# Patient Record
Sex: Male | Born: 1980
Health system: Southern US, Community
[De-identification: ages and names within clinical notes are randomized; demographics above are authoritative.]

## PROBLEM LIST (undated history)

## (undated) DIAGNOSIS — F909 Attention-deficit hyperactivity disorder, unspecified type: Secondary | ICD-10-CM

## (undated) DIAGNOSIS — K219 Gastro-esophageal reflux disease without esophagitis: Secondary | ICD-10-CM

## (undated) HISTORY — PX: APPENDECTOMY: SHX54

## (undated) HISTORY — DX: Gastro-esophageal reflux disease without esophagitis: K21.9

## (undated) HISTORY — DX: Attention-deficit hyperactivity disorder, unspecified type: F90.9

---

## 2009-03-05 ENCOUNTER — Ambulatory Visit: Payer: Self-pay | Admitting: Internal Medicine

## 2009-03-05 DIAGNOSIS — A630 Anogenital (venereal) warts: Secondary | ICD-10-CM | POA: Insufficient documentation

## 2009-03-05 DIAGNOSIS — F988 Other specified behavioral and emotional disorders with onset usually occurring in childhood and adolescence: Secondary | ICD-10-CM | POA: Insufficient documentation

## 2009-04-20 ENCOUNTER — Telehealth: Payer: Self-pay | Admitting: Internal Medicine

## 2009-05-31 ENCOUNTER — Telehealth: Payer: Self-pay | Admitting: Internal Medicine

## 2009-07-11 ENCOUNTER — Telehealth: Payer: Self-pay | Admitting: Internal Medicine

## 2009-08-20 ENCOUNTER — Ambulatory Visit: Payer: Self-pay | Admitting: Endocrinology

## 2009-08-20 DIAGNOSIS — M25519 Pain in unspecified shoulder: Secondary | ICD-10-CM | POA: Insufficient documentation

## 2009-08-29 ENCOUNTER — Telehealth (INDEPENDENT_AMBULATORY_CARE_PROVIDER_SITE_OTHER): Payer: Self-pay | Admitting: *Deleted

## 2009-09-17 ENCOUNTER — Encounter: Admission: RE | Admit: 2009-09-17 | Discharge: 2009-09-17 | Payer: Self-pay | Admitting: Specialist

## 2009-09-26 ENCOUNTER — Telehealth: Payer: Self-pay | Admitting: Internal Medicine

## 2009-10-29 ENCOUNTER — Telehealth: Payer: Self-pay | Admitting: Internal Medicine

## 2009-12-14 ENCOUNTER — Telehealth: Payer: Self-pay | Admitting: Internal Medicine

## 2010-02-14 ENCOUNTER — Telehealth: Payer: Self-pay | Admitting: Internal Medicine

## 2010-03-05 NOTE — Progress Notes (Signed)
Summary: REFILL  Phone Note Refill Request Call back at Home Phone [phone]   Refills Requested: Medication #1:  ADDERALL 10 MG TABS Take 1 tablet by mouth two times a day Initial call taken by: Lamar Sprinkles, CMA,  July 11, 2009 1:57 PM  Follow-up for Phone Call        pt informed via VM that requested Rx is available for pick up. Rx in cabinet up front Follow-up by: Margaret Pyle, CMA,  July 12, 2009 3:52 PM    Prescriptions: ADDERALL 10 MG TABS (AMPHETAMINE-DEXTROAMPHETAMINE) Take 1 tablet by mouth two times a day  #60 x 0   Entered and Authorized by:   Etta Grandchild MD   Signed by:   Etta Grandchild MD on 07/11/2009   Method used:   Print then Give to Patient   RxID:   [id number]

## 2010-03-05 NOTE — Assessment & Plan Note (Signed)
Summary: NEW/ [employer]/ [employer]  #   Vital Signs:  Patient profile:   30 year old male Height:      69 inches Weight:      170 pounds BMI:     25.20 O2 Sat:      98 % on Room air Temp:     98.6 degrees F oral Pulse rate:   68 / minute Pulse rhythm:   regular Resp:     16 per minute BP sitting:   126 / 80  (left arm) Cuff size:   large  Vitals Entered By: Rock Nephew CMA (March 05, 2009 4:11 PM)  Nutrition Counseling: Patient's BMI is greater than 25 and therefore counseled on weight management options.  O2 Flow:  Room air  Primary Care Provider:  Etta Grandchild MD   History of Present Illness: New to me ne needs a PCP but has concerns 1. warts at the base of his penis for several years 2. heart has an intermittently feeling that it is hollow since he was 38 yoa and smoked THC 3. wants a complete physical 4. needs a refill on Adderall, he's doing well on it  Preventive Screening-Counseling & Management  Alcohol-Tobacco     Alcohol drinks/day: <1     Alcohol type: spirits     >5/day in last 3 mos: no     Alcohol Counseling: not indicated; use of alcohol is not excessive or problematic     Feels need to cut down: no     Feels annoyed by complaints: no     Feels guilty re: drinking: no     Needs 'eye opener' in am: no     Smoking Status: never  Caffeine-Diet-Exercise     Does Patient Exercise: yes  Hep-HIV-STD-Contraception     Hepatitis Risk: no risk noted     HIV Risk: no risk noted     STD Risk: risk noted     STD Risk Counseling: to avoid increased STD risk     TSE monthly: yes     Testicular SE Education/Counseling to perform regular STE      Sexual History:  currently monogamous.        Drug Use:  former and no.        Blood Transfusions:  no.    Clinical Review Panels:  Immunizations   Last Tetanus Booster:  Tdap (02/03/2005)   Medications Prior to Update: 1)  None  Current Medications (verified): 1)  Adderall 10 Mg Tabs  (Amphetamine-Dextroamphetamine) .... Take 1 Tablet By Mouth Two Times A Day  Allergies (verified): No Known Drug Allergies  Past History:  Past Medical History: ADHD  Past Surgical History: Appendectomy  Family History: Family History High cholesterol Family History Hypertension  Social History: Occupation: Pensions consultant Single Never Smoked Alcohol use-yes Drug use-no Regular exercise-yes Smoking Status:  never Drug Use:  former, no Does Patient Exercise:  yes Hepatitis Risk:  no risk noted HIV Risk:  no risk noted STD Risk:  risk noted Sexual History:  currently monogamous Blood Transfusions:  no  Review of Systems       The patient complains of weight gain.  The patient denies anorexia, fever, weight loss, syncope, dyspnea on exertion, abdominal pain, enlarged lymph nodes, and testicular masses.   CV:  Denies chest pain or discomfort, difficulty breathing at night, fainting, fatigue, leg cramps with exertion, lightheadness, near fainting, palpitations, shortness of breath with exertion, swelling of feet, swelling of hands, and weight gain. GU:  Complains of genital sores; denies discharge, dysuria, hematuria, incontinence, nocturia, urinary frequency, and urinary hesitancy. Psych:  Denies anxiety, depression, easily angered, easily tearful, irritability, panic attacks, sense of great danger, suicidal thoughts/plans, and thoughts of violence.  Physical Exam  General:  alert, well-developed, well-nourished, well-hydrated, appropriate dress, normal appearance, healthy-appearing, cooperative to examination, and good hygiene.   Head:  normocephalic, atraumatic, no abnormalities observed, and no abnormalities palpated.   Eyes:  vision grossly intact, pupils equal, pupils round, and pupils reactive to light.   Ears:  R ear normal and L ear normal.   Mouth:  Oral mucosa and oropharynx without lesions or exudates.  Teeth in good repair. Neck:  No deformities, masses, or tenderness  noted. Chest Wall:  No deformities, masses, tenderness or gynecomastia noted. Lungs:  Normal respiratory effort, chest expands symmetrically. Lungs are clear to auscultation, no crackles or wheezes. Heart:  Normal rate and regular rhythm. S1 and S2 normal without gallop, murmur, click, rub or other extra sounds. Abdomen:  soft, non-tender, normal bowel sounds, no distention, no masses, no guarding, no rigidity, no rebound tenderness, no abdominal hernia, no inguinal hernia, no hepatomegaly, no splenomegaly, and abdominal scar(s).   Genitalia:  circumcised, no hydrocele, no varicocele, no scrotal masses, no testicular masses or atrophy, no urethral discharge, and condylomata at the left base of his penis.   Additional Exam:  EKG has early repol. but no q waves and no acute ST/T wave changes.   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  Discussed exercise and checking cholesterol.  Discussed gun safety and safe sex.  Complete Medication List: 1)  Adderall 10 Mg Tabs (Amphetamine-dextroamphetamine) .... Take 1 tablet by mouth two times a day 2)  Aldara 5 % Crea (Imiquimod) .... Apply to warts w monday/wednesday/friday for 4 weeks  Patient Instructions: 1)  Please schedule a follow-up appointment in 1 month. 2)  If you could be exposed to sexually transmitted diseases, you should use a  condom. Prescriptions: ADDERALL 10 MG TABS (AMPHETAMINE-DEXTROAMPHETAMINE) Take 1 tablet by mouth two times a day  #60 x 0   Entered and Authorized by:   Etta Grandchild MD   Signed by:   Etta Grandchild MD on 03/05/2009   Method used:   Print then Give to Patient   RxID:   [id number] ALDARA 5 % CREA (IMIQUIMOD) apply to warts w Monday/Wednesday/Friday for 4 weeks  #8 packs x 1   Entered and Authorized by:   Etta Grandchild MD   Signed by:   Etta Grandchild MD on 03/05/2009   Method used:   Print then Give to Patient   RxID:   [id number]   Preventive Care Screening  Last Tetanus Booster:    Date:  02/03/2005    Results:  Tdap

## 2010-03-05 NOTE — Progress Notes (Signed)
Summary: REFILL  Phone Note Refill Request   Refills Requested: Medication #1:  ADDERALL 10 MG TABS Take 1 tablet by mouth two times a day Initial call taken by: Lamar Sprinkles, CMA,  April 20, 2009 12:15 PM  Follow-up for Phone Call        Called and left pt a message to call the office back Follow-up by: Rock Nephew CMA,  April 23, 2009 8:40 AM/ Sydell Axon, SMA  Additional Follow-up for Phone Call Additional follow up Details #1::        Patient notified rx ready for pick up and placed up front. Additional Follow-up by: Rock Nephew CMA,  April 23, 2009 11:24 AM    Prescriptions: ADDERALL 10 MG TABS (AMPHETAMINE-DEXTROAMPHETAMINE) Take 1 tablet by mouth two times a day  #60 x 0   Entered and Authorized by:   Etta Grandchild MD   Signed by:   Etta Grandchild MD on 04/20/2009   Method used:   Print then Give to Patient   RxID:   [id number]

## 2010-03-05 NOTE — Progress Notes (Signed)
Summary: REFILL  Phone Note Refill Request Call back at Home Phone [phone]   Refills Requested: Medication #1:  ADDERALL 10 MG TABS Take 1 tablet by mouth two times a day Initial call taken by: Lamar Sprinkles, CMA,  October 29, 2009 10:02 AM  Follow-up for Phone Call        [facility] rx ready and place dup front.Marland KitchenMarland KitchenAlvy Beal Archie CMA  October 29, 2009 11:55 AM     Prescriptions: ADDERALL 10 MG TABS (AMPHETAMINE-DEXTROAMPHETAMINE) Take 1 tablet by mouth two times a day  #60 x 0   Entered and Authorized by:   Etta Grandchild MD   Signed by:   Etta Grandchild MD on 10/29/2009   Method used:   Print then Give to Patient   RxID:   [id number]

## 2010-03-05 NOTE — Progress Notes (Signed)
Summary: Adderall refill  Phone Note Refill Request Message from:  Patient on May 31, 2009 10:57 AM  Refills Requested: Medication #1:  ADDERALL 10 MG TABS Take 1 tablet by mouth two times a day Initial call taken by: Lucious Groves,  May 31, 2009 10:57 AM  Follow-up for Phone Call        left message for patient to call back Follow-up by: Brenton Grills,  May 31, 2009 1:21 PM    Prescriptions: ADDERALL 10 MG TABS (AMPHETAMINE-DEXTROAMPHETAMINE) Take 1 tablet by mouth two times a day  #60 x 0   Entered and Authorized by:   Etta Grandchild MD   Signed by:   Etta Grandchild MD on 05/31/2009   Method used:   Print then Give to Patient   RxID:   [id number]

## 2010-03-05 NOTE — Progress Notes (Signed)
  Phone Note Other Incoming   Request: Send information Summary of Call: Request for records received from [facility]. Request forwarded to [employer].

## 2010-03-05 NOTE — Progress Notes (Signed)
  Phone Note Call from Patient Call back at [facility] Phone [phone]   Caller: Patient Call For: Dr Yetta Barre Summary of Call: Pt requests refill of Adderall.Please advise. Initial call taken by: Verdell Face,  September 26, 2009 11:36 AM  Follow-up for Phone Call        Patient notified.Marland KitchenMarland KitchenAlvy Beal Archie CMA  September 26, 2009 1:51 PM   rx placed upfront    Prescriptions: ADDERALL 10 MG TABS (AMPHETAMINE-DEXTROAMPHETAMINE) Take 1 tablet by mouth two times a day  #60 x 0   Entered and Authorized by:   Etta Grandchild MD   Signed by:   Etta Grandchild MD on 09/26/2009   Method used:   Print then Give to Patient   RxID:   [id number]

## 2010-03-05 NOTE — Assessment & Plan Note (Signed)
Summary: DISLOCATED SHOULDER AGAIN/ DIDN'T GO TO ER/[facility] PT / NWS   Vital Signs:  Patient profile:   30 year old male Height:      69 inches (175.26 cm) Weight:      183.25 pounds (83.30 kg) BMI:     27.16 O2 Sat:      97 % on Room air Temp:     98.6 degrees F (37.00 degrees C) oral Pulse rate:   77 / minute BP sitting:   122 / 82  (left arm) Cuff size:   large  Vitals Entered By: Brenton Grills MA (August 20, 2009 3:16 PM)  O2 Flow:  Room air CC: dislocated right shoulder yesterday/took Aleve for pain/[facility] pt/aj Is Patient Diabetic? No   Primary Provider:  Etta Grandchild MD  CC:  dislocated right shoulder yesterday/took Aleve for pain/[facility] pt/aj.  History of Present Illness: [place], [place] and [place].  he broke his fall with his right arm, and has moderate pain there x 1 day, since the fall.  there is associated numbness at the shoulder.  he has dislocated his right shoulder in the past, 1-2x/year.  however, this episode was worse than the others.  since the fall, he thought it has relocated.  he was told in the past that he possibly had a torn labrum, but has not seen orthopedic dr since he moved to [place] 9 mos ago.    Current Medications (verified): 1)  Adderall 10 Mg Tabs (Amphetamine-Dextroamphetamine) .... Take 1 Tablet By Mouth Two Times A Day 2)  Aldara 5 % Crea (Imiquimod) .... Apply To Warts W Monday/wednesday/friday For 4 Weeks  Allergies (verified): No Known Drug Allergies  Review of Systems  The patient denies syncope.         denies pain elsewhere in the body  Physical Exam  Msk:  right shoulder: cannot abduct at all, due to pain. there is diffuse tenderness. Pulses:  right radial is intact Neurologic:  sensation is intact to touch on the right hand Additional Exam:  DG SHOULDER *R* - [id number] No fracture or dislocation.   Impression & Recommendations:  Problem # 1:  SHOULDER PAIN, RIGHT (ICD-719.41) Assessment  New uncertain etiology  Medications Added to Medication List This Visit: 1)  Hydrocodone-acetaminophen 10-325 Mg Tabs (Hydrocodone-acetaminophen) .Marland Kitchen.. 1 every 4 hrs as needed for pain  Other Orders: Orthopedic Surgeon Referral (Ortho Surgeon) T-Shoulder Right (73030TC) Est. Patient Level IV (84696)  Patient Instructions: 1)  x ray 2)  vicodin 1 every 4 hrn as needed for pain. 3)  refer orthopedics.   4)  here is a refill of the adderall.  please see dr Yetta Barre within 30 days, which would be necessary for any future refills.   5)  (update: i left message on phone-tree:  rx as we discussed) Prescriptions: ADDERALL 10 MG TABS (AMPHETAMINE-DEXTROAMPHETAMINE) Take 1 tablet by mouth two times a day  #60 x 0   Entered and Authorized by:   Minus Breeding MD   Signed by:   Minus Breeding MD on 08/20/2009   Method used:   Print then Give to Patient   RxID:   [id number] HYDROCODONE-ACETAMINOPHEN 10-325 MG TABS (HYDROCODONE-ACETAMINOPHEN) 1 every 4 hrs as needed for pain  #50 x 0   Entered and Authorized by:   Minus Breeding MD   Signed by:   Minus Breeding MD on 08/20/2009   Method used:   Print then Give to Patient  RxID:   [id number]

## 2010-03-05 NOTE — Progress Notes (Signed)
  Phone Note Refill Request Call back at Home Phone [phone]   Refills Requested: Medication #1:  ADDERALL 10 MG TABS Take 1 tablet by mouth two times a day Initial call taken by: Lamar Sprinkles, CMA,  December 14, 2009 10:06 AM  Follow-up for Phone Call        [facility] for pt to pick up/la Follow-up by: Rock Nephew CMA,  December 14, 2009 10:49 AM    Prescriptions: ADDERALL 10 MG TABS (AMPHETAMINE-DEXTROAMPHETAMINE) Take 1 tablet by mouth two times a day  #120 x 0   Entered and Authorized by:   Etta Grandchild MD   Signed by:   Etta Grandchild MD on 12/14/2009   Method used:   Print then Give to Patient   RxID:   [id number]

## 2010-03-07 NOTE — Progress Notes (Signed)
Summary: REFILL ADDERALL   Phone Note Refill Request   Refills Requested: Medication #1:  ADDERALL 10 MG TABS Take 1 tablet by mouth two times a day Initial call taken by: Lamar Sprinkles, CMA,  February 14, 2010 1:59 PM  Follow-up for Phone Call        left mess to call office back, rx ready Follow-up by: Lamar Sprinkles, CMA,  February 14, 2010 4:44 PM  Additional Follow-up for Phone Call Additional follow up Details #1::        Pt advised via VM, rx in cabinet fot pt pick up Additional Follow-up by: Margaret Pyle, CMA,  February 15, 2010 9:44 AM    Prescriptions: ADDERALL 10 MG TABS (AMPHETAMINE-DEXTROAMPHETAMINE) Take 1 tablet by mouth two times a day  #120 x 0   Entered and Authorized by:   Etta Grandchild MD   Signed by:   Etta Grandchild MD on 02/14/2010   Method used:   Print then Give to Patient   RxID:   [id number]

## 2010-05-06 ENCOUNTER — Telehealth: Payer: Self-pay | Admitting: Internal Medicine

## 2010-05-06 DIAGNOSIS — F909 Attention-deficit hyperactivity disorder, unspecified type: Secondary | ICD-10-CM

## 2010-05-06 MED ORDER — AMPHETAMINE-DEXTROAMPHETAMINE 10 MG PO TABS: 10.0000 mg | ORAL_TABLET | Freq: Two times a day (BID) | ORAL | Status: DC

## 2010-05-06 NOTE — Telephone Encounter (Signed)
Done, please have him f/up with me soon

## 2010-05-06 NOTE — Telephone Encounter (Signed)
Pt requesting refill for Adderall. Please inform when ready for p/u.

## 2010-05-08 ENCOUNTER — Telehealth: Payer: Self-pay

## 2010-05-08 NOTE — Telephone Encounter (Signed)
Patient notified to pick up signed rx. rx placed upfront

## 2010-05-08 NOTE — Telephone Encounter (Signed)
LMOM that along w/Kisha's msge from earlier today that his Rx script is available for p/u at office that he also needs to contact our office to schedule a f/u OV w/ Dr Yetta Barre.

## 2010-06-25 ENCOUNTER — Telehealth: Payer: Self-pay | Admitting: *Deleted

## 2010-06-25 DIAGNOSIS — F909 Attention-deficit hyperactivity disorder, unspecified type: Secondary | ICD-10-CM

## 2010-06-25 MED ORDER — AMPHETAMINE-DEXTROAMPHETAMINE 10 MG PO TABS: 10.0000 mg | ORAL_TABLET | Freq: Two times a day (BID) | ORAL | Status: DC

## 2010-06-25 NOTE — Telephone Encounter (Signed)
Patient requesting RF of Adderall. He had OV scheduled for this week but was r/s by MD. Rip Harbour for RF?

## 2010-06-25 NOTE — Telephone Encounter (Signed)
Ok for standard refills - 3 Rx's

## 2010-06-25 NOTE — Telephone Encounter (Signed)
Pending signature

## 2010-06-26 NOTE — Telephone Encounter (Signed)
Left vm for pt that RX was ready for pick up. Only printed one RX as this is what PCP usually gives pt.

## 2010-06-28 ENCOUNTER — Ambulatory Visit: Payer: Self-pay | Admitting: Internal Medicine

## 2010-08-05 ENCOUNTER — Ambulatory Visit (INDEPENDENT_AMBULATORY_CARE_PROVIDER_SITE_OTHER): Payer: BC Managed Care – PPO | Admitting: Internal Medicine

## 2010-08-05 ENCOUNTER — Encounter: Payer: Self-pay | Admitting: Internal Medicine

## 2010-08-05 VITALS — BP 118/78 | HR 56 | Temp 98.6°F | Resp 16 | Wt 177.8 lb

## 2010-08-05 DIAGNOSIS — F988 Other specified behavioral and emotional disorders with onset usually occurring in childhood and adolescence: Secondary | ICD-10-CM

## 2010-08-05 DIAGNOSIS — F909 Attention-deficit hyperactivity disorder, unspecified type: Secondary | ICD-10-CM

## 2010-08-05 MED ORDER — AMPHETAMINE-DEXTROAMPHETAMINE 10 MG PO TABS: 10.0000 mg | ORAL_TABLET | Freq: Two times a day (BID) | ORAL | Status: DC

## 2010-08-05 NOTE — Progress Notes (Signed)
  Subjective:    Patient ID: Kenneth Haney, male    DOB: 15-Apr-1980, 30 y.o.   MRN: [id number]  HPI He returns for f/up and he tells me that he is doing well on Adderall with no side effects. 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   Review of Systems  Constitutional: Negative for fever, chills, diaphoresis, activity change, appetite change, fatigue and unexpected weight change.  HENT: Negative for facial swelling, neck pain and neck stiffness.   Eyes: Negative.   Respiratory: Negative.   Cardiovascular: Negative.   Gastrointestinal: Negative.   Genitourinary: Negative.   Musculoskeletal: Negative.   Skin: Negative.   Neurological: Negative.   Hematological: Negative.   Psychiatric/Behavioral: Negative for suicidal ideas, hallucinations, behavioral problems, confusion, sleep disturbance, self-injury, dysphoric mood, decreased concentration and agitation. The patient is not nervous/anxious and is not hyperactive.        Objective:   Physical Exam  Vitals reviewed. Constitutional: He is oriented to person, place, and time. He appears well-developed and well-nourished. No distress.  HENT:  Head: Normocephalic and atraumatic.  Right Ear: External ear normal.  Left Ear: External ear normal.  Nose: Nose normal.  Mouth/Throat: No oropharyngeal exudate.  Eyes: Conjunctivae and EOM are normal. Pupils are equal, round, and reactive to light. Right eye exhibits no discharge. Left eye exhibits no discharge. No scleral icterus.  Neck: Normal range of motion. Neck supple. No JVD present. No tracheal deviation present. No thyromegaly present.  Cardiovascular: Normal rate, regular rhythm, normal heart sounds and intact distal pulses.  Exam reveals no gallop and no friction rub.   No murmur heard. Pulmonary/Chest: Effort normal and breath sounds normal. No stridor. No respiratory distress. He has no wheezes. He has no rales. He exhibits no tenderness.  Abdominal:  Soft. Bowel sounds are normal. He exhibits no distension and no mass. There is no tenderness. There is no rebound and no guarding.  Musculoskeletal: Normal range of motion. He exhibits no edema and no tenderness.  Lymphadenopathy:    He has no cervical adenopathy.  Neurological: He is alert and oriented to person, place, and time. He has normal reflexes. He displays normal reflexes. No cranial nerve deficit. He exhibits normal muscle tone.  Skin: Skin is warm and dry. No rash noted. He is not diaphoretic. No erythema. No pallor.  Psychiatric: He has a normal mood and affect. His behavior is normal. Judgment and thought content normal.          Assessment & Plan:

## 2010-08-05 NOTE — Assessment & Plan Note (Signed)
Continue same

## 2010-11-07 ENCOUNTER — Telehealth: Payer: Self-pay | Admitting: *Deleted

## 2010-11-07 DIAGNOSIS — F909 Attention-deficit hyperactivity disorder, unspecified type: Secondary | ICD-10-CM

## 2010-11-07 MED ORDER — AMPHETAMINE-DEXTROAMPHETAMINE 10 MG PO TABS: 10.0000 mg | ORAL_TABLET | Freq: Two times a day (BID) | ORAL | Status: DC

## 2010-11-07 NOTE — Telephone Encounter (Signed)
RX ready for pick up, left pt VM

## 2010-11-07 NOTE — Telephone Encounter (Signed)
done

## 2010-11-07 NOTE — Telephone Encounter (Signed)
Patient requesting RF of Adderall.  

## 2010-12-25 ENCOUNTER — Other Ambulatory Visit: Payer: Self-pay

## 2010-12-25 DIAGNOSIS — F909 Attention-deficit hyperactivity disorder, unspecified type: Secondary | ICD-10-CM

## 2010-12-25 NOTE — Telephone Encounter (Signed)
Refill request for adderall 

## 2011-01-07 ENCOUNTER — Telehealth: Payer: Self-pay

## 2011-01-07 NOTE — Telephone Encounter (Signed)
Patient called requesting refill for adderall, per MD pt needs to be seen

## 2011-01-14 ENCOUNTER — Telehealth: Payer: Self-pay

## 2011-01-14 DIAGNOSIS — F909 Attention-deficit hyperactivity disorder, unspecified type: Secondary | ICD-10-CM

## 2011-01-14 MED ORDER — AMPHETAMINE-DEXTROAMPHETAMINE 10 MG PO TABS: 10.0000 mg | ORAL_TABLET | Freq: Two times a day (BID) | ORAL | Status: DC

## 2011-01-14 NOTE — Telephone Encounter (Signed)
done

## 2011-01-14 NOTE — Telephone Encounter (Signed)
Patient called [facility] stating that adderall was denied due to needing an appt. Appointment has been set for 02/06/10 and pt would like to request enough medication to last until that appt. Per pt, he doesn't have any more time off or sick days at work through the end of the year. Please advise

## 2011-01-14 NOTE — Telephone Encounter (Signed)
Patient notified

## 2011-02-07 ENCOUNTER — Ambulatory Visit: Payer: BC Managed Care – PPO | Admitting: Internal Medicine

## 2011-02-12 ENCOUNTER — Encounter: Payer: Self-pay | Admitting: Internal Medicine

## 2011-02-12 ENCOUNTER — Ambulatory Visit (INDEPENDENT_AMBULATORY_CARE_PROVIDER_SITE_OTHER): Payer: BC Managed Care – PPO | Admitting: Internal Medicine

## 2011-02-12 VITALS — BP 120/78 | HR 73 | Temp 98.8°F | Resp 16 | Wt 192.0 lb

## 2011-02-12 DIAGNOSIS — F988 Other specified behavioral and emotional disorders with onset usually occurring in childhood and adolescence: Secondary | ICD-10-CM

## 2011-02-12 DIAGNOSIS — Z23 Encounter for immunization: Secondary | ICD-10-CM

## 2011-02-12 DIAGNOSIS — F909 Attention-deficit hyperactivity disorder, unspecified type: Secondary | ICD-10-CM

## 2011-02-12 MED ORDER — AMPHETAMINE-DEXTROAMPHETAMINE 10 MG PO TABS: 10.0000 mg | ORAL_TABLET | Freq: Two times a day (BID) | ORAL | Status: DC

## 2011-02-12 NOTE — Patient Instructions (Signed)
Attention Deficit Hyperactivity Disorder Attention deficit hyperactivity disorder (ADHD) is a problem with behavior issues based on the way the brain functions (neurobehavioral disorder). It is a common reason for behavior and academic problems in school. CAUSES  The cause of ADHD is unknown in most cases. It may run in families. It sometimes can be associated with learning disabilities and other behavioral problems. SYMPTOMS  There are 3 types of ADHD. The 3 types and some of the symptoms include:  Inattentive   Gets bored or distracted easily.   Loses or forgets things. Forgets to hand in homework.   Has trouble organizing or completing tasks.   Difficulty staying on task.   An inability to organize daily tasks and school work.   Leaving projects, chores, or homework unfinished.   Trouble paying attention or responding to details. Careless mistakes.   Difficulty following directions. Often seems like is not listening.   Dislikes activities that require sustained attention (like chores or homework).   Hyperactive-impulsive   Feels like it is impossible to sit still or stay in a seat. Fidgeting with hands and feet.   Trouble waiting turn.   Talking too much or out of turn. Interruptive.   Speaks or acts impulsively.   Aggressive, disruptive behavior.   Constantly busy or on the go, noisy.   Combined   Has symptoms of both of the above.  Often children with ADHD feel discouraged about themselves and with school. They often perform well below their abilities in school. These symptoms can cause problems in home, school, and in relationships with peers. As children get older, the excess motor activities can calm down, but the problems with paying attention and staying organized persist. Most children do not outgrow ADHD but with good treatment can learn to cope with the symptoms. DIAGNOSIS  When ADHD is suspected, the diagnosis should be made by professionals trained in  ADHD.  Diagnosis will include:  Ruling out other reasons for the child's behavior.   The caregivers will check with the child's school and check their medical records.   They will talk to teachers and parents.   Behavior rating scales for the child will be filled out by those dealing with the child on a daily basis.  A diagnosis is made only after all information has been considered. TREATMENT  Treatment usually includes behavioral treatment often along with medicines. It may include stimulant medicines. The stimulant medicines decrease impulsivity and hyperactivity and increase attention. Other medicines used include antidepressants and certain blood pressure medicines. Most experts agree that treatment for ADHD should address all aspects of the child's functioning. Treatment should not be limited to the use of medicines alone. Treatment should include structured classroom management. The parents must receive education to address rewarding good behavior, discipline, and limit-setting. Tutoring or behavioral therapy or both should be available for the child. If untreated, the disorder can have long-term serious effects into adolescence and adulthood. HOME CARE INSTRUCTIONS   Often with ADHD there is a lot of frustration among the family in dealing with the illness. There is often blame and anger that is not warranted. This is a life long illness. There is no way to prevent ADHD. In many cases, because the problem affects the family as a whole, the entire family may need help. A therapist can help the family find better ways to handle the disruptive behaviors and promote change. If the child is young, most of the therapist's work is with the parents. Parents will   learn techniques for coping with and improving their child's behavior. Sometimes only the child with the ADHD needs counseling. Your caregivers can help you make these decisions.   Children with ADHD may need help in organizing. Some  helpful tips include:   Keep routines the same every day from wake-up time to bedtime. Schedule everything. This includes homework and playtime. This should include outdoor and indoor recreation. Keep the schedule on the refrigerator or a bulletin board where it is frequently seen. Mark schedule changes as far in advance as possible.   Have a place for everything and keep everything in its place. This includes clothing, backpacks, and school supplies.   Encourage writing down assignments and bringing home needed books.   Offer your child a well-balanced diet. Breakfast is especially important for school performance. Children should avoid drinks with caffeine including:   Soft drinks.   Coffee.   Tea.   However, some older children (adolescents) may find these drinks helpful in improving their attention.   Children with ADHD need consistent rules that they can understand and follow. If rules are followed, give small rewards. Children with ADHD often receive, and expect, criticism. Look for good behavior and praise it. Set realistic goals. Give clear instructions. Look for activities that can foster success and self-esteem. Make time for pleasant activities with your child. Give lots of affection.   Parents are their children's greatest advocates. Learn as much as possible about ADHD. This helps you become a stronger and better advocate for your child. It also helps you educate your child's teachers and instructors if they feel inadequate in these areas. Parent support groups are often helpful. A national group with local chapters is called CHADD (Children and Adults with Attention Deficit Hyperactivity Disorder).  PROGNOSIS  There is no cure for ADHD. Children with the disorder seldom outgrow it. Many find adaptive ways to accommodate the ADHD as they mature. SEEK MEDICAL CARE IF:  Your child has repeated muscle twitches, cough or speech outbursts.   Your child has sleep problems.   Your  child has a marked loss of appetite.   Your child develops depression.   Your child has new or worsening behavioral problems.   Your child develops dizziness.   Your child has a racing heart.   Your child has stomach pains.   Your child develops headaches.  Document Released: 01/10/2002 Document Revised: 10/02/2010 Document Reviewed: 08/23/2007 [employer] Patient Information 2012 [employer]. 

## 2011-02-13 ENCOUNTER — Encounter: Payer: Self-pay | Admitting: Internal Medicine

## 2011-02-13 NOTE — Progress Notes (Signed)
  Subjective:    Patient ID: Kenneth Haney, male    DOB: 09/28/1980, 31 y.o.   MRN: [id number]  HPI He returns for a refill on adderall and he tells me that he is doing well.   Review of Systems  Constitutional: Negative.   HENT: Negative.   Eyes: Negative.   Respiratory: Negative.   Cardiovascular: Negative for chest pain, palpitations and leg swelling.  Gastrointestinal: Negative.   Genitourinary: Negative.   Musculoskeletal: Negative.   Skin: Negative.   Psychiatric/Behavioral: Positive for decreased concentration. Negative for suicidal ideas, hallucinations, behavioral problems, confusion, sleep disturbance, self-injury, dysphoric mood and agitation. The patient is not nervous/anxious and is not hyperactive.        Objective:   Physical Exam  Vitals reviewed. Constitutional: He is oriented to person, place, and time. He appears well-developed and well-nourished. He appears distressed.  HENT:  Head: Normocephalic and atraumatic.  Mouth/Throat: Oropharynx is clear and moist. No oropharyngeal exudate.  Eyes: Conjunctivae are normal. Right eye exhibits no discharge. Left eye exhibits no discharge. No scleral icterus.  Neck: Normal range of motion. Neck supple. No JVD present. No tracheal deviation present. No thyromegaly present.  Cardiovascular: Normal rate and regular rhythm.   Pulmonary/Chest: Effort normal and breath sounds normal. No stridor. No respiratory distress. He has no wheezes. He has no rales. He exhibits no tenderness.  Abdominal: Soft. Bowel sounds are normal. He exhibits no distension and no mass. There is no tenderness. There is no rebound and no guarding.  Musculoskeletal: Normal range of motion. He exhibits no edema and no tenderness.  Lymphadenopathy:    He has no cervical adenopathy.  Neurological: He is oriented to person, place, and time.  Skin: Skin is warm and dry. No rash noted. He is not diaphoretic. No erythema. No pallor.  Psychiatric: He has a  normal mood and affect. His behavior is normal. Judgment and thought content normal.          Assessment & Plan:

## 2011-02-13 NOTE — Assessment & Plan Note (Signed)
He is doing well, will continue the same for now

## 2011-06-10 ENCOUNTER — Telehealth: Payer: Self-pay

## 2011-06-10 DIAGNOSIS — F909 Attention-deficit hyperactivity disorder, unspecified type: Secondary | ICD-10-CM

## 2011-06-10 NOTE — Telephone Encounter (Signed)
Patient called Kenneth Haney requesting temp refill of adderral. Pt last seen 02/12/11 and given 3 mos supply and his next scheduled appt is 06/27/11. Please advise if ok to supply a script for May

## 2011-06-11 MED ORDER — AMPHETAMINE-DEXTROAMPHETAMINE 10 MG PO TABS: 10.0000 mg | ORAL_TABLET | Freq: Two times a day (BID) | ORAL | Status: DC

## 2011-06-11 NOTE — Telephone Encounter (Signed)
yes

## 2011-06-11 NOTE — Telephone Encounter (Signed)
Patient notified to pick up 

## 2011-06-27 ENCOUNTER — Ambulatory Visit (INDEPENDENT_AMBULATORY_CARE_PROVIDER_SITE_OTHER): Payer: BC Managed Care – PPO | Admitting: Internal Medicine

## 2011-06-27 ENCOUNTER — Encounter: Payer: Self-pay | Admitting: Internal Medicine

## 2011-06-27 VITALS — BP 120/80 | HR 76 | Temp 98.6°F | Resp 16 | Wt 195.0 lb

## 2011-06-27 DIAGNOSIS — F988 Other specified behavioral and emotional disorders with onset usually occurring in childhood and adolescence: Secondary | ICD-10-CM

## 2011-06-27 DIAGNOSIS — L309 Dermatitis, unspecified: Secondary | ICD-10-CM | POA: Insufficient documentation

## 2011-06-27 DIAGNOSIS — F909 Attention-deficit hyperactivity disorder, unspecified type: Secondary | ICD-10-CM

## 2011-06-27 DIAGNOSIS — L259 Unspecified contact dermatitis, unspecified cause: Secondary | ICD-10-CM

## 2011-06-27 MED ORDER — TRIAMCINOLONE ACETONIDE 0.5 % EX CREA: TOPICAL_CREAM | Freq: Three times a day (TID) | CUTANEOUS | Status: AC

## 2011-06-27 MED ORDER — AMPHETAMINE-DEXTROAMPHETAMINE 10 MG PO TABS: 10.0000 mg | ORAL_TABLET | Freq: Two times a day (BID) | ORAL | Status: DC

## 2011-06-27 NOTE — Assessment & Plan Note (Signed)
Continue adderall at the current dose 

## 2011-06-27 NOTE — Patient Instructions (Signed)

## 2011-06-27 NOTE — Assessment & Plan Note (Signed)
Start TAC cream to the AA

## 2011-06-27 NOTE — Progress Notes (Signed)
  Subjective:    Patient ID: Kenneth Haney, male    DOB: Apr 07, 1980, 31 y.o.   MRN: [id number]  Rash This is a new problem. The current episode started in the past 7 days. The problem is unchanged. The affected locations include the right buttock. The rash is characterized by dryness and itchiness. He was exposed to a new detergent/soap. Pertinent negatives include no anorexia, congestion, cough, diarrhea, eye pain, facial edema, fatigue, fever, joint pain, nail changes, rhinorrhea, shortness of breath or vomiting. Past treatments include nothing. His past medical history is significant for eczema.      Review of Systems  Constitutional: Negative for fever, chills, diaphoresis, activity change, appetite change, fatigue and unexpected weight change.  HENT: Negative.  Negative for congestion and rhinorrhea.   Eyes: Negative.  Negative for pain.  Respiratory: Negative for cough, choking, chest tightness, shortness of breath, wheezing and stridor.   Cardiovascular: Negative for chest pain, palpitations and leg swelling.  Gastrointestinal: Negative for nausea, vomiting, abdominal pain, diarrhea, constipation, abdominal distention, anal bleeding and anorexia.  Genitourinary: Negative.   Musculoskeletal: Negative for joint pain.  Skin: Positive for rash. Negative for nail changes, color change, pallor and wound.  Neurological: Negative for dizziness, tremors, seizures, syncope, facial asymmetry, speech difficulty, weakness, light-headedness, numbness and headaches.  Hematological: Negative for adenopathy. Does not bruise/bleed easily.  Psychiatric/Behavioral: Positive for decreased concentration. Negative for suicidal ideas, hallucinations, behavioral problems, confusion, sleep disturbance, self-injury, dysphoric mood and agitation. The patient is not nervous/anxious and is not hyperactive.        Objective:   Physical Exam  Vitals reviewed. Constitutional: He is oriented to person, place, and  time. He appears well-developed and well-nourished. No distress.  HENT:  Head: Normocephalic and atraumatic.  Mouth/Throat: Oropharynx is clear and moist. No oropharyngeal exudate.  Eyes: Conjunctivae are normal. Right eye exhibits no discharge. Left eye exhibits no discharge. No scleral icterus.  Neck: Normal range of motion. Neck supple. No JVD present. No tracheal deviation present. No thyromegaly present.  Cardiovascular: Normal rate, regular rhythm, normal heart sounds and intact distal pulses.  Exam reveals no gallop and no friction rub.   No murmur heard. Pulmonary/Chest: Effort normal and breath sounds normal. No stridor. No respiratory distress. He has no wheezes. He has no rales. He exhibits no tenderness.  Abdominal: Soft. Bowel sounds are normal. He exhibits no distension. There is no tenderness. There is no rebound and no guarding.  Musculoskeletal: Normal range of motion. He exhibits no edema and no tenderness.  Lymphadenopathy:    He has no cervical adenopathy.  Neurological: He is oriented to person, place, and time.  Skin: Skin is warm, dry and intact. Rash noted. No abrasion, no bruising, no burn, no ecchymosis, no laceration, no lesion, no petechiae and no purpura noted. Rash is papular (scaly and xerotic). Rash is not macular, not maculopapular, not nodular, not pustular, not vesicular and not urticarial. He is not diaphoretic. No cyanosis or erythema. No pallor. Nails show no clubbing.     Psychiatric: He has a normal mood and affect. His behavior is normal. Judgment and thought content normal.          Assessment & Plan:

## 2011-12-11 ENCOUNTER — Ambulatory Visit (INDEPENDENT_AMBULATORY_CARE_PROVIDER_SITE_OTHER): Payer: BC Managed Care – PPO | Admitting: Internal Medicine

## 2011-12-11 ENCOUNTER — Encounter: Payer: Self-pay | Admitting: Internal Medicine

## 2011-12-11 VITALS — BP 120/70 | HR 86 | Temp 98.3°F | Resp 16 | Wt 208.2 lb

## 2011-12-11 DIAGNOSIS — F988 Other specified behavioral and emotional disorders with onset usually occurring in childhood and adolescence: Secondary | ICD-10-CM

## 2011-12-11 DIAGNOSIS — K219 Gastro-esophageal reflux disease without esophagitis: Secondary | ICD-10-CM

## 2011-12-11 DIAGNOSIS — F909 Attention-deficit hyperactivity disorder, unspecified type: Secondary | ICD-10-CM

## 2011-12-11 DIAGNOSIS — Z23 Encounter for immunization: Secondary | ICD-10-CM

## 2011-12-11 MED ORDER — OMEPRAZOLE 20 MG PO CPDR: 20.0000 mg | DELAYED_RELEASE_CAPSULE | Freq: Every day | ORAL | Status: DC

## 2011-12-11 MED ORDER — AMPHETAMINE-DEXTROAMPHETAMINE 10 MG PO TABS: 10.0000 mg | ORAL_TABLET | Freq: Two times a day (BID) | ORAL | Status: DC

## 2011-12-11 NOTE — Assessment & Plan Note (Signed)
He will work on his lifestyle modifications and will try prilosec for symptom relief

## 2011-12-11 NOTE — Patient Instructions (Signed)

## 2011-12-11 NOTE — Assessment & Plan Note (Signed)
Continue adderall at the current dose 

## 2011-12-11 NOTE — Progress Notes (Signed)
  Subjective:    Patient ID: Kenneth Haney, male    DOB: 1980/03/17, 31 y.o.   MRN: [id number]  Gastrophageal Reflux He complains of heartburn. He reports no abdominal pain, no belching, no chest pain, no choking, no coughing, no dysphagia, no early satiety, no globus sensation, no hoarse voice, no nausea, no sore throat, no stridor, no tooth decay, no water brash or no wheezing. This is a recurrent problem. The current episode started more than 1 year ago. The problem occurs occasionally. The problem has been gradually worsening. The heartburn duration is several minutes. The heartburn is located in the substernum. The heartburn is of mild intensity. The heartburn does not wake him from sleep. The heartburn does not limit his activity. The heartburn doesn't change with position. The symptoms are aggravated by caffeine, ETOH and smoking. Pertinent negatives include no anemia, fatigue, melena, muscle weakness, orthopnea or weight loss. Risk factors include smoking/tobacco exposure and ETOH use. He has tried an antacid for the symptoms. The treatment provided moderate relief.      Review of Systems  Constitutional: Negative for fever, chills, weight loss, diaphoresis, activity change, appetite change, fatigue and unexpected weight change.  HENT: Negative.  Negative for sore throat and hoarse voice.   Eyes: Negative.   Respiratory: Negative for cough, choking, shortness of breath, wheezing and stridor.   Cardiovascular: Negative for chest pain, palpitations and leg swelling.  Gastrointestinal: Positive for heartburn. Negative for dysphagia, nausea, vomiting, abdominal pain, diarrhea, constipation, blood in stool, melena, abdominal distention, anal bleeding and rectal pain.  Genitourinary: Negative.   Musculoskeletal: Negative.  Negative for muscle weakness.  Skin: Negative.   Neurological: Negative.   Hematological: Negative for adenopathy. Does not bruise/bleed easily.  Psychiatric/Behavioral:  Positive for decreased concentration. Negative for suicidal ideas, hallucinations, behavioral problems, confusion, sleep disturbance, self-injury, dysphoric mood and agitation. The patient is not hyperactive.        Objective:   Physical Exam  Vitals reviewed. Constitutional: He is oriented to person, place, and time. He appears well-developed and well-nourished. No distress.  HENT:  Head: Normocephalic and atraumatic.  Mouth/Throat: Oropharynx is clear and moist. No oropharyngeal exudate.  Eyes: Conjunctivae normal are normal. Right eye exhibits no discharge. Left eye exhibits no discharge. No scleral icterus.  Neck: Normal range of motion. Neck supple. No JVD present. No tracheal deviation present. No thyromegaly present.  Cardiovascular: Normal rate, regular rhythm, normal heart sounds and intact distal pulses.  Exam reveals no gallop and no friction rub.   No murmur heard. Pulmonary/Chest: Effort normal and breath sounds normal. No stridor. No respiratory distress. He has no wheezes. He has no rales. He exhibits no tenderness.  Abdominal: Soft. Bowel sounds are normal. He exhibits no distension and no mass. There is no tenderness. There is no rebound and no guarding.  Musculoskeletal: Normal range of motion. He exhibits no edema and no tenderness.  Lymphadenopathy:    He has no cervical adenopathy.  Neurological: He is oriented to person, place, and time.  Skin: Skin is warm and dry. No rash noted. He is not diaphoretic. No erythema. No pallor.  Psychiatric: He has a normal mood and affect. His behavior is normal. Judgment and thought content normal.     No results found for this basename: WBC, HGB, HCT, PLT, GLUCOSE, CHOL, TRIG, HDL, LDLDIRECT, LDLCALC, ALT, AST, NA, K, CL, CREATININE, BUN, CO2, TSH, PSA, INR, GLUF, HGBA1C, MICROALBUR       Assessment & Plan:

## 2012-06-07 ENCOUNTER — Telehealth: Payer: Self-pay | Admitting: Internal Medicine

## 2012-06-07 DIAGNOSIS — F909 Attention-deficit hyperactivity disorder, unspecified type: Secondary | ICD-10-CM

## 2012-06-07 DIAGNOSIS — F988 Other specified behavioral and emotional disorders with onset usually occurring in childhood and adolescence: Secondary | ICD-10-CM

## 2012-06-07 MED ORDER — AMPHETAMINE-DEXTROAMPHETAMINE 10 MG PO TABS: 10.0000 mg | ORAL_TABLET | Freq: Two times a day (BID) | ORAL | Status: DC

## 2012-06-07 NOTE — Telephone Encounter (Signed)
Pt notified to pick up 

## 2012-06-07 NOTE — Telephone Encounter (Signed)
Pt req refill for Adderall, pt has an appt 06/11/12. Pt was wondering if he can have some until he comes in for the fu appt. Please advise.

## 2012-06-07 NOTE — Telephone Encounter (Signed)
done

## 2012-06-11 ENCOUNTER — Ambulatory Visit (INDEPENDENT_AMBULATORY_CARE_PROVIDER_SITE_OTHER): Payer: BC Managed Care – PPO

## 2012-06-11 ENCOUNTER — Ambulatory Visit (INDEPENDENT_AMBULATORY_CARE_PROVIDER_SITE_OTHER): Payer: BC Managed Care – PPO | Admitting: Internal Medicine

## 2012-06-11 ENCOUNTER — Encounter: Payer: Self-pay | Admitting: Internal Medicine

## 2012-06-11 VITALS — BP 136/90 | HR 93 | Temp 98.8°F | Resp 16 | Wt 200.0 lb

## 2012-06-11 DIAGNOSIS — Z0000 Encounter for general adult medical examination without abnormal findings: Secondary | ICD-10-CM | POA: Insufficient documentation

## 2012-06-11 DIAGNOSIS — F988 Other specified behavioral and emotional disorders with onset usually occurring in childhood and adolescence: Secondary | ICD-10-CM

## 2012-06-11 DIAGNOSIS — I10 Essential (primary) hypertension: Secondary | ICD-10-CM | POA: Insufficient documentation

## 2012-06-11 DIAGNOSIS — F909 Attention-deficit hyperactivity disorder, unspecified type: Secondary | ICD-10-CM

## 2012-06-11 LAB — COMPREHENSIVE METABOLIC PANEL
AST: 24 U/L (ref 0–37)
Albumin: 4 g/dL (ref 3.5–5.2)
Alkaline Phosphatase: 60 U/L (ref 39–117)
BUN: 16 mg/dL (ref 6–23)
Calcium: 8.9 mg/dL (ref 8.4–10.5)
Creatinine, Ser: 1.1 mg/dL (ref 0.4–1.5)
GFR: 80.93 mL/min (ref >60.00)
Potassium: 3.8 mEq/L (ref 3.5–5.1)
Total Protein: 7.3 g/dL (ref 6.0–8.3)

## 2012-06-11 LAB — TSH: TSH: 1.35 u[IU]/mL (ref 0.35–5.50)

## 2012-06-11 LAB — LIPID PANEL
Cholesterol: 282 mg/dL — ABNORMAL HIGH (ref 0–200)
Total CHOL/HDL Ratio: 7
Triglycerides: 281 mg/dL — ABNORMAL HIGH (ref 0.0–149.0)

## 2012-06-11 LAB — URINALYSIS, ROUTINE W REFLEX MICROSCOPIC
Specific Gravity, Urine: 1.02 (ref 1.000–1.030)
Urine Glucose: NEGATIVE
Urobilinogen, UA: 0.2 (ref 0.0–1.0)
pH: 7 (ref 5.0–8.0)

## 2012-06-11 LAB — CBC WITH DIFFERENTIAL/PLATELET
Eosinophils Absolute: 0 10*3/uL (ref 0.0–0.7)
Eosinophils Relative: 0.6 % (ref 0.0–5.0)
HCT: 42.1 % (ref 39.0–52.0)
Lymphs Abs: 1.8 10*3/uL (ref 0.7–4.0)
MCHC: 34.2 g/dL (ref 30.0–36.0)
MCV: 89.2 fl (ref 78.0–100.0)
Monocytes Absolute: 0.6 10*3/uL (ref 0.1–1.0)
Neutrophils Relative %: 71 % (ref 43.0–77.0)
Platelets: 261 10*3/uL (ref 150.0–400.0)
RDW: 14.2 % (ref 11.5–14.6)

## 2012-06-11 MED ORDER — AMPHETAMINE-DEXTROAMPHETAMINE 10 MG PO TABS: 10.0000 mg | ORAL_TABLET | Freq: Two times a day (BID) | ORAL | Status: DC

## 2012-06-11 NOTE — Patient Instructions (Signed)
Health Maintenance, Males A healthy lifestyle and preventative care can promote health and wellness.  Maintain regular health, dental, and eye exams.  Eat a healthy diet. Foods like vegetables, fruits, whole grains, low-fat dairy products, and lean protein foods contain the nutrients you need without too many calories. Decrease your intake of foods high in solid fats, added sugars, and salt. Get information about a proper diet from your caregiver, if necessary.  Regular physical exercise is one of the most important things you can do for your health. Most adults should get at least 150 minutes of moderate-intensity exercise (any activity that increases your heart rate and causes you to sweat) each week. In addition, most adults need muscle-strengthening exercises on 2 or more days a week.   Maintain a healthy weight. The body mass index (BMI) is a screening tool to identify possible weight problems. It provides an estimate of body fat based on height and weight. Your caregiver can help determine your BMI, and can help you achieve or maintain a healthy weight. For adults 20 years and older:  A BMI below 18.5 is considered underweight.  A BMI of 18.5 to 24.9 is normal.  A BMI of 25 to 29.9 is considered overweight.  A BMI of 30 and above is considered obese.  Maintain normal blood lipids and cholesterol by exercising and minimizing your intake of saturated fat. Eat a balanced diet with plenty of fruits and vegetables. Blood tests for lipids and cholesterol should begin at age 20 and be repeated every 5 years. If your lipid or cholesterol levels are high, you are over 50, or you are a high risk for heart disease, you may need your cholesterol levels checked more frequently.Ongoing high lipid and cholesterol levels should be treated with medicines, if diet and exercise are not effective.  If you smoke, find out from your caregiver how to quit. If you do not use tobacco, do not start.  If you  choose to drink alcohol, do not exceed 2 drinks per day. One drink is considered to be 12 ounces (355 mL) of beer, 5 ounces (148 mL) of wine, or 1.5 ounces (44 mL) of liquor.  Avoid use of street drugs. Do not share needles with anyone. Ask for help if you need support or instructions about stopping the use of drugs.  High blood pressure causes heart disease and increases the risk of stroke. Blood pressure should be checked at least every 1 to 2 years. Ongoing high blood pressure should be treated with medicines if weight loss and exercise are not effective.  If you are 45 to 30 years old, ask your caregiver if you should take aspirin to prevent heart disease.  Diabetes screening involves taking a blood sample to check your fasting blood sugar level. This should be done once every 3 years, after age 45, if you are within normal weight and without risk factors for diabetes. Testing should be considered at a younger age or be carried out more frequently if you are overweight and have at least 1 risk factor for diabetes.  Colorectal cancer can be detected and often prevented. Most routine colorectal cancer screening begins at the age of 50 and continues through age 75. However, your caregiver may recommend screening at an earlier age if you have risk factors for colon cancer. On a yearly basis, your caregiver may provide home test kits to check for hidden blood in the stool. Use of a small camera at the end of a tube,   to directly examine the colon (sigmoidoscopy or colonoscopy), can detect the earliest forms of colorectal cancer. Talk to your caregiver about this at age 50, when routine screening begins. Direct examination of the colon should be repeated every 5 to 10 years through age 75, unless early forms of pre-cancerous polyps or small growths are found.  Hepatitis C blood testing is recommended for all people born from 1945 through 1965 and any individual with known risks for hepatitis C.  Healthy  men should no longer receive prostate-specific antigen (PSA) blood tests as part of routine cancer screening. Consult with your caregiver about prostate cancer screening.  Testicular cancer screening is not recommended for adolescents or adult males who have no symptoms. Screening includes self-exam, caregiver exam, and other screening tests. Consult with your caregiver about any symptoms you have or any concerns you have about testicular cancer.  Practice safe sex. Use condoms and avoid high-risk sexual practices to reduce the spread of sexually transmitted infections (STIs).  Use sunscreen with a sun protection factor (SPF) of 30 or greater. Apply sunscreen liberally and repeatedly throughout the day. You should seek shade when your shadow is shorter than you. Protect yourself by wearing long sleeves, pants, a wide-brimmed hat, and sunglasses year round, whenever you are outdoors.  Notify your caregiver of new moles or changes in moles, especially if there is a change in shape or color. Also notify your caregiver if a mole is larger than the size of a pencil eraser.  A one-time screening for abdominal aortic aneurysm (AAA) and surgical repair of large AAAs by sound wave imaging (ultrasonography) is recommended for ages 65 to 75 years who are current or former smokers.  Stay current with your immunizations. Document Released: 07/19/2007 Document Revised: 04/14/2011 Document Reviewed: 06/17/2010 [employer] Patient Information 2013 [employer].  

## 2012-06-12 LAB — DRUGS OF ABUSE SCREEN W/O ALC, ROUTINE URINE
Amphetamine Screen, Ur: POSITIVE — AB
Barbiturate Quant, Ur: NEGATIVE
Cocaine Metabolites: NEGATIVE
Marijuana Metabolite: NEGATIVE
Methadone: NEGATIVE

## 2012-06-13 NOTE — Assessment & Plan Note (Signed)
He has a mild elevation in his BP I will check his labs today to look for end organ damage and secondary causes of HTN I have asked him to start lifestyle modifications Will monitor his BP and treat if needed He is not willing to stop the amphetamines

## 2012-06-13 NOTE — Progress Notes (Signed)
  Subjective:    Patient ID: Kenneth Haney, male    DOB: [birth year], 32 y.o.   MRN: [id number]  Hypertension This is a new problem. The current episode started more than 1 month ago. The problem is unchanged. The problem is uncontrolled. Pertinent negatives include no anxiety, blurred vision, chest pain, headaches, malaise/fatigue, neck pain, orthopnea, palpitations, peripheral edema, PND, shortness of breath or sweats. Agents associated with hypertension include amphetamines. Past treatments include nothing. Compliance problems include exercise and diet.       Review of Systems  Constitutional: Negative.  Negative for malaise/fatigue.  HENT: Negative.  Negative for neck pain.   Eyes: Negative.  Negative for blurred vision.  Respiratory: Negative.  Negative for apnea, cough, choking, shortness of breath, wheezing and stridor.   Cardiovascular: Negative.  Negative for chest pain, palpitations, orthopnea, leg swelling and PND.  Gastrointestinal: Negative.  Negative for nausea, vomiting, abdominal pain, diarrhea and constipation.  Endocrine: Negative.   Genitourinary: Negative.   Musculoskeletal: Negative.   Skin: Negative.   Allergic/Immunologic: Negative.   Neurological: Negative.  Negative for dizziness, weakness, light-headedness and headaches.  Hematological: Negative.  Negative for adenopathy. Does not bruise/bleed easily.  Psychiatric/Behavioral: Positive for decreased concentration. Negative for suicidal ideas, hallucinations, behavioral problems, confusion, sleep disturbance, self-injury, dysphoric mood and agitation. The patient is not nervous/anxious and is not hyperactive.        Objective:   Physical Exam  Vitals reviewed. Constitutional: He is oriented to person, place, and time. He appears well-developed and well-nourished. No distress.  HENT:  Head: Normocephalic and atraumatic.  Mouth/Throat: Oropharynx is clear and moist. No oropharyngeal exudate.  Eyes: Conjunctivae  are normal. Right eye exhibits no discharge. Left eye exhibits no discharge. No scleral icterus.  Neck: Normal range of motion. Neck supple. No JVD present. No tracheal deviation present. No thyromegaly present.  Cardiovascular: Normal rate, regular rhythm, normal heart sounds and intact distal pulses.  Exam reveals no gallop and no friction rub.   No murmur heard. Pulmonary/Chest: Effort normal and breath sounds normal. No stridor. No respiratory distress. He has no wheezes. He has no rales. He exhibits no tenderness.  Abdominal: Soft. Bowel sounds are normal. He exhibits no distension and no mass. There is no tenderness. There is no rebound and no guarding. Hernia confirmed negative in the right inguinal area and confirmed negative in the left inguinal area.  Genitourinary: Testes normal and penis normal. Right testis shows no mass, no swelling and no tenderness. Right testis is descended. Left testis shows no mass, no swelling and no tenderness. Left testis is descended. No phimosis, paraphimosis, hypospadias, penile erythema or penile tenderness. No discharge found.  Musculoskeletal: Normal range of motion. He exhibits no edema and no tenderness.  Lymphadenopathy:    He has no cervical adenopathy.       Right: No inguinal adenopathy present.       Left: No inguinal adenopathy present.  Neurological: He is oriented to person, place, and time.  Skin: Skin is warm and dry. No rash noted. He is not diaphoretic. No erythema. No pallor.  Psychiatric: He has a normal mood and affect. His behavior is normal. Judgment and thought content normal.          Assessment & Plan:

## 2012-06-13 NOTE — Assessment & Plan Note (Signed)
Continue adderall at the current dose I will check his UDS today

## 2012-06-13 NOTE — Assessment & Plan Note (Signed)
Exam done Vaccines were reviewed Labs ordered Pt ed material was given 

## 2012-06-14 ENCOUNTER — Encounter: Payer: Self-pay | Admitting: Internal Medicine

## 2012-06-14 LAB — LDL CHOLESTEROL, DIRECT: Direct LDL: 191.3 mg/dL

## 2012-06-15 LAB — AMPHETAMINES (GC/LC/MS), URINE
Amphetamine GC/MS Conf: >1000 ng/mL
MDA GC/MS confirm: NEGATIVE ng/mL
MDEA GC/MS Conf: NEGATIVE ng/mL
Methamphetamine Quant, Ur: NEGATIVE ng/mL

## 2013-01-05 ENCOUNTER — Encounter: Payer: Self-pay | Admitting: Internal Medicine

## 2013-01-05 ENCOUNTER — Ambulatory Visit (INDEPENDENT_AMBULATORY_CARE_PROVIDER_SITE_OTHER): Payer: BC Managed Care – PPO | Admitting: Internal Medicine

## 2013-01-05 VITALS — BP 120/80 | HR 80 | Temp 98.6°F | Resp 16 | Ht 69.0 in | Wt 186.0 lb

## 2013-01-05 DIAGNOSIS — D239 Other benign neoplasm of skin, unspecified: Secondary | ICD-10-CM

## 2013-01-05 DIAGNOSIS — F909 Attention-deficit hyperactivity disorder, unspecified type: Secondary | ICD-10-CM

## 2013-01-05 DIAGNOSIS — F988 Other specified behavioral and emotional disorders with onset usually occurring in childhood and adolescence: Secondary | ICD-10-CM

## 2013-01-05 DIAGNOSIS — D229 Melanocytic nevi, unspecified: Secondary | ICD-10-CM | POA: Insufficient documentation

## 2013-01-05 MED ORDER — AMPHETAMINE-DEXTROAMPHETAMINE 10 MG PO TABS: 10.0000 mg | ORAL_TABLET | Freq: Two times a day (BID) | ORAL | Status: DC

## 2013-01-05 NOTE — Assessment & Plan Note (Signed)
Derm referral as requested

## 2013-01-05 NOTE — Progress Notes (Signed)
   Subjective:    Patient ID: Kenneth Haney, male    DOB: October 14, 1980, 32 y.o.   MRN: [id number]  HPI Comments: He returns for a refill on his meds for ADHD but he also complains of a mole on his left forearm that he wants to see a dermatologist about.     Review of Systems  Constitutional: Negative.   HENT: Negative.   Eyes: Negative.   Respiratory: Negative.   Cardiovascular: Negative.   Gastrointestinal: Negative.   Endocrine: Negative.   Genitourinary: Negative.   Musculoskeletal: Negative.   Skin: Negative.   Allergic/Immunologic: Negative.   Neurological: Negative.   Hematological: Negative.  Negative for adenopathy. Does not bruise/bleed easily.  Psychiatric/Behavioral: Positive for decreased concentration. Negative for suicidal ideas, hallucinations, behavioral problems, confusion, sleep disturbance, self-injury, dysphoric mood and agitation. The patient is not nervous/anxious and is not hyperactive.        Objective:   Physical Exam  Vitals reviewed. Constitutional: He is oriented to person, place, and time. He appears well-developed and well-nourished. No distress.  HENT:  Head: Normocephalic and atraumatic.  Mouth/Throat: Oropharynx is clear and moist. No oropharyngeal exudate.  Eyes: Conjunctivae are normal. Right eye exhibits no discharge. Left eye exhibits no discharge. No scleral icterus.  Neck: Normal range of motion. Neck supple. No JVD present. No tracheal deviation present. No thyromegaly present.  Cardiovascular: Normal rate, regular rhythm, normal heart sounds and intact distal pulses.  Exam reveals no gallop and no friction rub.   No murmur heard. Pulmonary/Chest: Effort normal and breath sounds normal. No stridor. No respiratory distress. He has no wheezes. He has no rales. He exhibits no tenderness.  Abdominal: Soft. Bowel sounds are normal. He exhibits no distension and no mass. There is no tenderness. There is no rebound and no guarding.    Musculoskeletal: Normal range of motion. He exhibits no edema and no tenderness.  Lymphadenopathy:    He has no cervical adenopathy.  Neurological: He is oriented to person, place, and time.  Skin: Skin is warm and dry. No rash noted. He is not diaphoretic. No erythema. No pallor.        Lab Results  Component Value Date   WBC 8.5 06/11/2012   HGB 14.4 06/11/2012   HCT 42.1 06/11/2012   PLT 261.0 06/11/2012   GLUCOSE 91 06/11/2012   CHOL 282* 06/11/2012   TRIG 281.0* 06/11/2012   HDL 42.20 06/11/2012   LDLDIRECT 191.3 06/11/2012   ALT 39 06/11/2012   AST 24 06/11/2012   NA 134* 06/11/2012   K 3.8 06/11/2012   CL 101 06/11/2012   CREATININE 1.1 06/11/2012   BUN 16 06/11/2012   CO2 24 06/11/2012   TSH 1.35 06/11/2012       Assessment & Plan:

## 2013-01-05 NOTE — Assessment & Plan Note (Signed)
He is doing well on adderall Will continue the same dose

## 2013-01-05 NOTE — Progress Notes (Signed)
Pre visit review using our clinic review tool, if applicable. No additional management support is needed unless otherwise documented below in the visit note. 

## 2013-01-05 NOTE — Patient Instructions (Signed)
Attention Deficit Hyperactivity Disorder Attention deficit hyperactivity disorder (ADHD) is a problem with behavior issues based on the way the brain functions (neurobehavioral disorder). It is a common reason for behavior and academic problems in school. CAUSES  The cause of ADHD is unknown in most cases. It may run in families. It sometimes can be associated with learning disabilities and other behavioral problems. SYMPTOMS  There are 3 types of ADHD. The 3 types and some of the symptoms include:  Inattentive  Gets bored or distracted easily.  Loses or forgets things. Forgets to hand in homework.  Has trouble organizing or completing tasks.  Difficulty staying on task.  An inability to organize daily tasks and school work.  Leaving projects, chores, or homework unfinished.  Trouble paying attention or responding to details. Careless mistakes.  Difficulty following directions. Often seems like is not listening.  Dislikes activities that require sustained attention (like chores or homework).  Hyperactive-impulsive  Feels like it is impossible to sit still or stay in a seat. Fidgeting with hands and feet.  Trouble waiting turn.  Talking too much or out of turn. Interruptive.  Speaks or acts impulsively.  Aggressive, disruptive behavior.  Constantly busy or on the go, noisy.  Combined  Has symptoms of both of the above. Often children with ADHD feel discouraged about themselves and with school. They often perform well below their abilities in school. These symptoms can cause problems in home, school, and in relationships with peers. As children get older, the excess motor activities can calm down, but the problems with paying attention and staying organized persist. Most children do not outgrow ADHD but with good treatment can learn to cope with the symptoms. DIAGNOSIS  When ADHD is suspected, the diagnosis should be made by professionals trained in ADHD.  Diagnosis will  include:  Ruling out other reasons for the child's behavior.  The caregivers will check with the child's school and check their medical records.  They will talk to teachers and parents.  Behavior rating scales for the child will be filled out by those dealing with the child on a daily basis. A diagnosis is made only after all information has been considered. TREATMENT  Treatment usually includes behavioral treatment often along with medicines. It may include stimulant medicines. The stimulant medicines decrease impulsivity and hyperactivity and increase attention. Other medicines used include antidepressants and certain blood pressure medicines. Most experts agree that treatment for ADHD should address all aspects of the child's functioning. Treatment should not be limited to the use of medicines alone. Treatment should include structured classroom management. The parents must receive education to address rewarding good behavior, discipline, and limit-setting. Tutoring or behavioral therapy or both should be available for the child. If untreated, the disorder can have long-term serious effects into adolescence and adulthood. HOME CARE INSTRUCTIONS   Often with ADHD there is a lot of frustration among the family in dealing with the illness. There is often blame and anger that is not warranted. This is a life long illness. There is no way to prevent ADHD. In many cases, because the problem affects the family as a whole, the entire family may need help. A therapist can help the family find better ways to handle the disruptive behaviors and promote change. If the child is young, most of the therapist's work is with the parents. Parents will learn techniques for coping with and improving their child's behavior. Sometimes only the child with the ADHD needs counseling. Your caregivers can help   you make these decisions.  Children with ADHD may need help in organizing. Some helpful tips include:  Keep  routines the same every day from wake-up time to bedtime. Schedule everything. This includes homework and playtime. This should include outdoor and indoor recreation. Keep the schedule on the refrigerator or a bulletin board where it is frequently seen. Mark schedule changes as far in advance as possible.  Have a place for everything and keep everything in its place. This includes clothing, backpacks, and school supplies.  Encourage writing down assignments and bringing home needed books.  Offer your child a well-balanced diet. Breakfast is especially important for school performance. Children should avoid drinks with caffeine including:  Soft drinks.  Coffee.  Tea.  However, some older children (adolescents) may find these drinks helpful in improving their attention.  Children with ADHD need consistent rules that they can understand and follow. If rules are followed, give small rewards. Children with ADHD often receive, and expect, criticism. Look for good behavior and praise it. Set realistic goals. Give clear instructions. Look for activities that can foster success and self-esteem. Make time for pleasant activities with your child. Give lots of affection.  Parents are their children's greatest advocates. Learn as much as possible about ADHD. This helps you become a stronger and better advocate for your child. It also helps you educate your child's teachers and instructors if they feel inadequate in these areas. Parent support groups are often helpful. A national group with local chapters is called CHADD (Children and Adults with Attention Deficit Hyperactivity Disorder). PROGNOSIS  There is no cure for ADHD. Children with the disorder seldom outgrow it. Many find adaptive ways to accommodate the ADHD as they mature. SEEK MEDICAL CARE IF:  Your child has repeated muscle twitches, cough or speech outbursts.  Your child has sleep problems.  Your child has a marked loss of  appetite.  Your child develops depression.  Your child has new or worsening behavioral problems.  Your child develops dizziness.  Your child has a racing heart.  Your child has stomach pains.  Your child develops headaches. Document Released: 01/10/2002 Document Revised: 04/14/2011 Document Reviewed: 08/11/2012 [employer] Patient Information 2014 [employer].  

## 2013-07-21 ENCOUNTER — Encounter: Payer: Self-pay | Admitting: Internal Medicine

## 2013-07-21 ENCOUNTER — Ambulatory Visit (INDEPENDENT_AMBULATORY_CARE_PROVIDER_SITE_OTHER): Payer: BC Managed Care – PPO | Admitting: Internal Medicine

## 2013-07-21 VITALS — BP 110/82 | HR 65 | Temp 98.5°F | Wt 194.0 lb

## 2013-07-21 DIAGNOSIS — F900 Attention-deficit hyperactivity disorder, predominantly inattentive type: Secondary | ICD-10-CM

## 2013-07-21 DIAGNOSIS — I10 Essential (primary) hypertension: Secondary | ICD-10-CM

## 2013-07-21 DIAGNOSIS — F988 Other specified behavioral and emotional disorders with onset usually occurring in childhood and adolescence: Secondary | ICD-10-CM

## 2013-07-21 DIAGNOSIS — A630 Anogenital (venereal) warts: Secondary | ICD-10-CM

## 2013-07-21 MED ORDER — AMPHETAMINE-DEXTROAMPHETAMINE 10 MG PO TABS: 10.0000 mg | ORAL_TABLET | Freq: Two times a day (BID) | ORAL | Status: DC

## 2013-07-21 MED ORDER — IMIQUIMOD 5 % EX CREA: TOPICAL_CREAM | CUTANEOUS | Status: DC

## 2013-07-21 NOTE — Progress Notes (Signed)
Subjective:    Patient ID: Kenneth Haney, male    DOB: 1980-04-30, 33 y.o.   MRN: [id number]  HPI Comments:  Pt states ADD status overall stable on current meds with overall good compliance and tolerability, and good effectiveness with respect to ability for concentration and task completion.     Review of Systems  Constitutional: Negative.  Negative for fever, chills, diaphoresis, appetite change and fatigue.  HENT: Negative.   Eyes: Negative.   Respiratory: Negative.  Negative for cough, choking, chest tightness, shortness of breath and stridor.   Cardiovascular: Negative.  Negative for chest pain, palpitations and leg swelling.  Gastrointestinal: Negative.  Negative for nausea, abdominal pain, diarrhea, constipation and blood in stool.  Endocrine: Negative.   Genitourinary: Negative.   Musculoskeletal: Negative.   Skin: Positive for rash. Negative for color change, pallor and wound.       He has genital warts on his scrotum  Allergic/Immunologic: Negative.   Neurological: Negative.   Hematological: Negative.  Negative for adenopathy. Does not bruise/bleed easily.  Psychiatric/Behavioral: Positive for decreased concentration. Negative for suicidal ideas, hallucinations, behavioral problems, confusion, sleep disturbance, self-injury, dysphoric mood and agitation. The patient is not nervous/anxious and is not hyperactive.        Objective:   Physical Exam  Vitals reviewed. Constitutional: He is oriented to person, place, and time. He appears well-developed and well-nourished. No distress.  HENT:  Head: Normocephalic and atraumatic.  Mouth/Throat: Oropharynx is clear and moist. No oropharyngeal exudate.  Eyes: Conjunctivae are normal. Right eye exhibits no discharge. Left eye exhibits no discharge. No scleral icterus.  Neck: Normal range of motion. Neck supple. No JVD present. No tracheal deviation present. No thyromegaly present.  Cardiovascular: Normal rate, regular rhythm,  normal heart sounds and intact distal pulses.  Exam reveals no gallop and no friction rub.   No murmur heard. Pulmonary/Chest: Effort normal and breath sounds normal. No stridor. No respiratory distress. He has no wheezes. He has no rales. He exhibits no tenderness.  Abdominal: Soft. Bowel sounds are normal. He exhibits no distension and no mass. There is no tenderness. There is no rebound and no guarding. Hernia confirmed negative in the right inguinal area and confirmed negative in the left inguinal area.  Genitourinary: Penis normal.    Right testis shows no mass, no swelling and no tenderness. Right testis is descended. Left testis shows no swelling and no tenderness. Left testis is descended. Circumcised. No penile erythema or penile tenderness. No discharge found.  Musculoskeletal: Normal range of motion. He exhibits no edema and no tenderness.  Lymphadenopathy:    He has no cervical adenopathy.       Right: No inguinal adenopathy present.       Left: No inguinal adenopathy present.  Neurological: He is oriented to person, place, and time.  Skin: Skin is warm and dry. Rash noted. He is not diaphoretic. No erythema. No pallor.     Lab Results  Component Value Date   WBC 8.5 06/11/2012   HGB 14.4 06/11/2012   HCT 42.1 06/11/2012   PLT 261.0 06/11/2012   GLUCOSE 91 06/11/2012   CHOL 282* 06/11/2012   TRIG 281.0* 06/11/2012   HDL 42.20 06/11/2012   LDLDIRECT 191.3 06/11/2012   ALT 39 06/11/2012   AST 24 06/11/2012   NA 134* 06/11/2012   K 3.8 06/11/2012   CL 101 06/11/2012   CREATININE 1.1 06/11/2012   BUN 16 06/11/2012   CO2 24 06/11/2012   TSH 1.35  06/11/2012       Assessment & Plan:

## 2013-07-21 NOTE — Assessment & Plan Note (Signed)
Will cont adderall at the current dose 

## 2013-07-21 NOTE — Assessment & Plan Note (Signed)
Will treat with aldara

## 2013-07-21 NOTE — Assessment & Plan Note (Signed)
His BP is well controlled with lifestyle modifications 

## 2013-07-21 NOTE — Progress Notes (Signed)
Pre visit review using our clinic review tool, if applicable. No additional management support is needed unless otherwise documented below in the visit note. 

## 2013-07-21 NOTE — Patient Instructions (Signed)

## 2013-07-22 ENCOUNTER — Telehealth: Payer: Self-pay | Admitting: Internal Medicine

## 2013-07-22 NOTE — Telephone Encounter (Signed)
Relevant patient education assigned to patient using Emmi. ° °

## 2013-09-30 ENCOUNTER — Encounter: Payer: Self-pay | Admitting: Internal Medicine

## 2013-09-30 ENCOUNTER — Ambulatory Visit (INDEPENDENT_AMBULATORY_CARE_PROVIDER_SITE_OTHER): Payer: BC Managed Care – PPO | Admitting: Internal Medicine

## 2013-09-30 VITALS — BP 148/102 | HR 93 | Temp 99.2°F | Resp 14 | Ht 69.0 in | Wt 189.8 lb

## 2013-09-30 DIAGNOSIS — R300 Dysuria: Secondary | ICD-10-CM

## 2013-09-30 DIAGNOSIS — N342 Other urethritis: Secondary | ICD-10-CM

## 2013-09-30 DIAGNOSIS — R309 Painful micturition, unspecified: Secondary | ICD-10-CM

## 2013-09-30 LAB — POCT URINALYSIS DIPSTICK
Bilirubin, UA: NEGATIVE
Blood, UA: NEGATIVE
Glucose, UA: NEGATIVE
Ketones, UA: NEGATIVE
Leukocytes, UA: NEGATIVE
NITRITE UA: NEGATIVE
PH UA: 6.5
Protein, UA: NEGATIVE
SPEC GRAV UA: 1.015
Urobilinogen, UA: 4

## 2013-09-30 MED ORDER — METHYLPREDNISOLONE ACETATE 80 MG/ML IJ SUSP
80.0000 mg | Freq: Once | INTRAMUSCULAR | Status: AC
  Administered 2013-09-30: 80 mg via INTRAMUSCULAR

## 2013-09-30 MED ORDER — DOXYCYCLINE HYCLATE 100 MG PO TABS: 100.0000 mg | ORAL_TABLET | Freq: Two times a day (BID) | ORAL | Status: DC

## 2013-09-30 NOTE — Progress Notes (Signed)
Pre visit review using our clinic review tool, if applicable. No additional management support is needed unless otherwise documented below in the visit note. 

## 2013-09-30 NOTE — Progress Notes (Signed)
   Subjective:    Patient ID: Kenneth Haney, male    DOB: 1980-05-26, 33 y.o.   MRN: [id number]  Dysuria  This is a new problem. The current episode started 1 to 4 weeks ago (2 weeks). The problem occurs intermittently. The problem has been waxing and waning. The quality of the pain is described as burning. The pain is mild. The maximum temperature recorded prior to his arrival was 100 - 100.9 F. He is sexually active. There is no history of pyelonephritis. Associated symptoms include chills and a discharge. Pertinent negatives include no frequency, hematuria or nausea. He has tried nothing for the symptoms. The treatment provided no relief.  STD tests were neg on Tue No h/o herpes    Review of Systems  Constitutional: Positive for chills. Negative for appetite change, fatigue and unexpected weight change.  HENT: Negative for congestion, nosebleeds, sneezing, sore throat and trouble swallowing.   Eyes: Negative for itching and visual disturbance.  Respiratory: Negative for cough.   Cardiovascular: Negative for chest pain, palpitations and leg swelling.  Gastrointestinal: Negative for nausea, diarrhea, blood in stool and abdominal distention.  Genitourinary: Positive for dysuria and discharge. Negative for frequency and hematuria.  Musculoskeletal: Negative for back pain, gait problem, joint swelling and neck pain.  Skin: Negative for rash.  Neurological: Negative for dizziness, tremors, speech difficulty and weakness.  Psychiatric/Behavioral: Negative for suicidal ideas, sleep disturbance, dysphoric mood and agitation. The patient is not nervous/anxious.        Objective:   Physical Exam  Constitutional: He appears well-developed. No distress.  Neck: No tracheal deviation present. No thyromegaly present.  Pulmonary/Chest: He has no rales.  Abdominal: There is no guarding.  Genitourinary: Penis normal. No penile tenderness.  Musculoskeletal: He exhibits no tenderness.    Lymphadenopathy:    He has no cervical adenopathy.  Neurological: Coordination normal.  Skin: No rash noted.  Psychiatric:  anxious    Recent STD tests - negative      Assessment & Plan:

## 2013-09-30 NOTE — Assessment & Plan Note (Addendum)
8/15 Rocephin IM Doxy 100 bid x 10 d Safe sex discussed

## 2013-10-04 ENCOUNTER — Encounter: Payer: Self-pay | Admitting: Internal Medicine

## 2014-01-12 ENCOUNTER — Telehealth: Payer: Self-pay | Admitting: Internal Medicine

## 2014-01-12 DIAGNOSIS — F900 Attention-deficit hyperactivity disorder, predominantly inattentive type: Secondary | ICD-10-CM

## 2014-01-12 DIAGNOSIS — F988 Other specified behavioral and emotional disorders with onset usually occurring in childhood and adolescence: Secondary | ICD-10-CM

## 2014-01-12 MED ORDER — AMPHETAMINE-DEXTROAMPHETAMINE 10 MG PO TABS: 10.0000 mg | ORAL_TABLET | Freq: Two times a day (BID) | ORAL | Status: DC

## 2014-01-12 NOTE — Telephone Encounter (Signed)
MD is out pls advise...Johny Chess

## 2014-01-12 NOTE — Telephone Encounter (Signed)
Done hardcopy to D

## 2014-01-12 NOTE — Telephone Encounter (Signed)
Pt calling for Adderall refill.

## 2014-01-12 NOTE — Telephone Encounter (Signed)
Notified pt rx ready for pick-up.../lmb 

## 2014-04-27 ENCOUNTER — Telehealth: Payer: Self-pay | Admitting: Internal Medicine

## 2014-04-27 NOTE — Telephone Encounter (Signed)
Pt called in for refill on his amphetamine-dextroamphetamine (ADDERALL) 10 MG tablet [[id number]]  To pick up

## 2014-04-27 NOTE — Telephone Encounter (Signed)
Patient last seen 09/2013, must be seen every three months for controlled medication.

## 2014-05-04 ENCOUNTER — Encounter: Payer: Self-pay | Admitting: Internal Medicine

## 2014-05-04 ENCOUNTER — Ambulatory Visit (INDEPENDENT_AMBULATORY_CARE_PROVIDER_SITE_OTHER): Payer: BLUE CROSS/BLUE SHIELD | Admitting: Internal Medicine

## 2014-05-04 VITALS — BP 130/82 | HR 88 | Temp 98.8°F | Resp 16 | Wt 199.0 lb

## 2014-05-04 DIAGNOSIS — F909 Attention-deficit hyperactivity disorder, unspecified type: Secondary | ICD-10-CM | POA: Diagnosis not present

## 2014-05-04 DIAGNOSIS — F900 Attention-deficit hyperactivity disorder, predominantly inattentive type: Secondary | ICD-10-CM | POA: Diagnosis not present

## 2014-05-04 DIAGNOSIS — F988 Other specified behavioral and emotional disorders with onset usually occurring in childhood and adolescence: Secondary | ICD-10-CM

## 2014-05-04 DIAGNOSIS — J302 Other seasonal allergic rhinitis: Secondary | ICD-10-CM

## 2014-05-04 DIAGNOSIS — J309 Allergic rhinitis, unspecified: Secondary | ICD-10-CM | POA: Insufficient documentation

## 2014-05-04 MED ORDER — AMPHETAMINE-DEXTROAMPHETAMINE 10 MG PO TABS: 10.0000 mg | ORAL_TABLET | Freq: Two times a day (BID) | ORAL | Status: DC

## 2014-05-04 MED ORDER — CETIRIZINE-PSEUDOEPHEDRINE ER 5-120 MG PO TB12: 1.0000 | ORAL_TABLET | Freq: Two times a day (BID) | ORAL | Status: DC

## 2014-05-04 MED ORDER — METHYLPREDNISOLONE (PAK) 4 MG PO TABS: ORAL_TABLET | ORAL | Status: DC

## 2014-05-04 NOTE — Patient Instructions (Signed)

## 2014-05-04 NOTE — Progress Notes (Signed)
Pre visit review using our clinic review tool, if applicable. No additional management support is needed unless otherwise documented below in the visit note. 

## 2014-05-05 NOTE — Progress Notes (Signed)
   Subjective:    Patient ID: Kenneth Haney, male    DOB: 1981/01/01, 34 y.o.   MRN: [id number]  HPI Comments: He complains of several weeks of runny nose, nasal congestion, and "popping" sensation in his right ear.     Review of Systems  Constitutional: Negative.  Negative for fever, chills, diaphoresis, appetite change and fatigue.  HENT: Positive for congestion, ear pain, postnasal drip and rhinorrhea. Negative for ear discharge, facial swelling, nosebleeds, sinus pressure, sneezing, sore throat, tinnitus and trouble swallowing.   Eyes: Negative.  Negative for visual disturbance.  Respiratory: Negative.  Negative for cough, choking, chest tightness, shortness of breath and stridor.   Cardiovascular: Negative.  Negative for chest pain, palpitations and leg swelling.  Gastrointestinal: Negative.  Negative for nausea, vomiting, abdominal pain, diarrhea, constipation and blood in stool.  Endocrine: Negative.   Genitourinary: Negative.   Musculoskeletal: Negative.  Negative for myalgias, back pain, joint swelling and arthralgias.  Skin: Negative.  Negative for rash.  Allergic/Immunologic: Negative.   Neurological: Negative.   Hematological: Negative.  Negative for adenopathy. Does not bruise/bleed easily.  Psychiatric/Behavioral: Positive for decreased concentration. Negative for hallucinations, behavioral problems, confusion, sleep disturbance, self-injury and dysphoric mood. The patient is not nervous/anxious and is not hyperactive.        Objective:   Physical Exam  Constitutional: He is oriented to person, place, and time. He appears well-developed and well-nourished.  Non-toxic appearance. He does not have a sickly appearance. He does not appear ill. No distress.  HENT:  Right Ear: Hearing, tympanic membrane, external ear and ear canal normal.  Left Ear: Hearing, tympanic membrane, external ear and ear canal normal.  Nose: Mucosal edema and rhinorrhea present. No nose lacerations,  sinus tenderness, nasal deformity, septal deviation or nasal septal hematoma. No epistaxis.  No foreign bodies. Right sinus exhibits no maxillary sinus tenderness and no frontal sinus tenderness. Left sinus exhibits no maxillary sinus tenderness and no frontal sinus tenderness.  Mouth/Throat: Oropharynx is clear and moist and mucous membranes are normal. Mucous membranes are not pale, not dry and not cyanotic. No oral lesions. No trismus in the jaw. No uvula swelling. No oropharyngeal exudate, posterior oropharyngeal edema, posterior oropharyngeal erythema or tonsillar abscesses.  Eyes: Conjunctivae are normal. Right eye exhibits no discharge. Left eye exhibits no discharge. No scleral icterus.  Neck: Normal range of motion. Neck supple. No JVD present. No tracheal deviation present. No thyromegaly present.  Cardiovascular: Normal rate, regular rhythm, normal heart sounds and intact distal pulses.  Exam reveals no gallop and no friction rub.   No murmur heard. Pulmonary/Chest: Effort normal and breath sounds normal. No stridor. No respiratory distress. He has no wheezes. He has no rales. He exhibits no tenderness.  Abdominal: Soft. Bowel sounds are normal. He exhibits no distension and no mass. There is no tenderness. There is no rebound and no guarding.  Musculoskeletal: Normal range of motion. He exhibits no edema or tenderness.  Lymphadenopathy:    He has no cervical adenopathy.  Neurological: He is oriented to person, place, and time.  Skin: Skin is warm and dry. No rash noted. He is not diaphoretic. No erythema. No pallor.  Psychiatric: He has a normal mood and affect. His behavior is normal. Judgment and thought content normal.  Vitals reviewed.         Assessment & Plan:

## 2014-05-05 NOTE — Assessment & Plan Note (Signed)
He has a flare of AR with eustachian tube dysfunction Will treat with zyrtec-d and a course of medrol dose pak

## 2014-05-05 NOTE — Assessment & Plan Note (Signed)
He is doing well on adderall Will continue

## 2015-01-02 ENCOUNTER — Encounter: Payer: Self-pay | Admitting: Internal Medicine

## 2015-01-02 ENCOUNTER — Ambulatory Visit (INDEPENDENT_AMBULATORY_CARE_PROVIDER_SITE_OTHER): Payer: BLUE CROSS/BLUE SHIELD | Admitting: Internal Medicine

## 2015-01-02 VITALS — BP 124/80 | HR 72 | Temp 97.6°F | Resp 16 | Ht 69.0 in | Wt 205.0 lb

## 2015-01-02 DIAGNOSIS — F909 Attention-deficit hyperactivity disorder, unspecified type: Secondary | ICD-10-CM

## 2015-01-02 DIAGNOSIS — F988 Other specified behavioral and emotional disorders with onset usually occurring in childhood and adolescence: Secondary | ICD-10-CM

## 2015-01-02 DIAGNOSIS — F900 Attention-deficit hyperactivity disorder, predominantly inattentive type: Secondary | ICD-10-CM | POA: Diagnosis not present

## 2015-01-02 MED ORDER — AMPHETAMINE-DEXTROAMPHETAMINE 10 MG PO TABS: 10.0000 mg | ORAL_TABLET | Freq: Two times a day (BID) | ORAL | Status: DC

## 2015-01-02 NOTE — Patient Instructions (Signed)

## 2015-01-02 NOTE — Progress Notes (Signed)
Pre visit review using our clinic review tool, if applicable. No additional management support is needed unless otherwise documented below in the visit note. 

## 2015-01-02 NOTE — Progress Notes (Signed)
Subjective:  Patient ID: Kenneth Haney, male    DOB: Oct 01, 1980  Age: 34 y.o. MRN: [id number]  CC: ADHD   HPI Kenneth Haney presents for f/up on ADHD,  Pt states ADD status overall stable on current meds with overall good compliance and tolerability, and good effectiveness with respect to ability for concentration and task completion.  Outpatient Prescriptions Prior to Visit  Medication Sig Dispense Refill  . cetirizine-pseudoephedrine (ZYRTEC-D) 5-120 MG per tablet Take 1 tablet by mouth 2 (two) times daily. 60 tablet 1  . imiquimod (ALDARA) 5 % cream Apply topically 3 (three) times a week. 12 each 1  . omeprazole (PRILOSEC) 20 MG capsule Take 1 capsule (20 mg total) by mouth daily. 48 capsule 0  . amphetamine-dextroamphetamine (ADDERALL) 10 MG tablet Take 1 tablet (10 mg total) by mouth 2 (two) times daily. Fill on or after 06/31/16 60 tablet 0  . methylPREDNIsolone (MEDROL DOSPACK) 4 MG tablet follow package directions 21 tablet 0   No facility-administered medications prior to visit.    ROS Review of Systems  Constitutional: Negative.  Negative for chills, diaphoresis, fatigue and unexpected weight change.  HENT: Negative.   Eyes: Negative.   Respiratory: Negative.  Negative for cough, choking, chest tightness, shortness of breath and stridor.   Cardiovascular: Negative.  Negative for chest pain, palpitations and leg swelling.  Gastrointestinal: Negative.  Negative for nausea, vomiting, abdominal pain, diarrhea and constipation.  Endocrine: Negative.   Genitourinary: Negative.   Musculoskeletal: Negative.  Negative for myalgias, back pain and arthralgias.  Skin: Negative.   Allergic/Immunologic: Negative.   Neurological: Negative.  Negative for dizziness.  Hematological: Negative.   Psychiatric/Behavioral: Positive for decreased concentration. Negative for suicidal ideas, hallucinations, behavioral problems, confusion, sleep disturbance, self-injury, dysphoric mood and  agitation. The patient is not nervous/anxious and is not hyperactive.     Objective:  BP 124/80 mmHg  Pulse 72  Temp(Src) 97.6 F (36.4 C) (Oral)  Ht 5\' 9"  (1.753 m)  Wt 205 lb (92.987 kg)  BMI 30.26 kg/m2  SpO2 99%  BP Readings from Last 3 Encounters:  01/02/15 124/80  05/04/14 130/82  09/30/13 148/102    Wt Readings from Last 3 Encounters:  01/02/15 205 lb (92.987 kg)  05/04/14 199 lb (90.266 kg)  09/30/13 189 lb 12.8 oz (86.093 kg)    Physical Exam  Constitutional: He is oriented to person, place, and time. No distress.  HENT:  Head: Normocephalic and atraumatic.  Mouth/Throat: Oropharynx is clear and moist. No oropharyngeal exudate.  Eyes: Conjunctivae are normal. Right eye exhibits no discharge. Left eye exhibits no discharge. No scleral icterus.  Neck: Normal range of motion. Neck supple. No JVD present. No tracheal deviation present. No thyromegaly present.  Cardiovascular: Normal rate, regular rhythm, normal heart sounds and intact distal pulses.  Exam reveals no gallop and no friction rub.   No murmur heard. Pulmonary/Chest: Effort normal and breath sounds normal. No stridor. No respiratory distress. He has no wheezes. He has no rales. He exhibits no tenderness.  Abdominal: Soft. Bowel sounds are normal. He exhibits no distension and no mass. There is no tenderness. There is no rebound and no guarding.  Musculoskeletal: Normal range of motion. He exhibits no edema or tenderness.  Lymphadenopathy:    He has no cervical adenopathy.  Neurological: He is oriented to person, place, and time.  Skin: Skin is warm and dry. No rash noted. He is not diaphoretic. No erythema. No pallor.  Psychiatric: He has a normal mood  and affect. His behavior is normal. Judgment and thought content normal.  Vitals reviewed.   Lab Results  Component Value Date   WBC 8.5 06/11/2012   HGB 14.4 06/11/2012   HCT 42.1 06/11/2012   PLT 261.0 06/11/2012   GLUCOSE 91 06/11/2012   CHOL 282*  06/11/2012   TRIG 281.0* 06/11/2012   HDL 42.20 06/11/2012   LDLDIRECT 191.3 06/11/2012   ALT 39 06/11/2012   AST 24 06/11/2012   NA 134* 06/11/2012   K 3.8 06/11/2012   CL 101 06/11/2012   CREATININE 1.1 06/11/2012   BUN 16 06/11/2012   CO2 24 06/11/2012   TSH 1.35 06/11/2012    Dg Fluoro Guide Ndl Plc/bx  09/17/2009  Clinical Data:  Pain.  Dislocation.  Assess for labral tear.  Fluoroscopy Time: 32 seconds  RIGHT SHOULDER INJECTION UNDER FLUOROSCOPY  Technique:  The skin overlying the right shoulder joint was cleansed with Betadine, draped in the usual sterile fashion, and infiltrated locally with 1% Lidocaine.  A 22 gauge spinal needle was advanced to the inferomedial margin of the humeral head on one pass under intermittent fluoroscopy.    A mixture of 0.1 ml Multihance 20 ml of dilute Conray 60 was then used to fill the right shoulder joint.  No apparent complication.  The patient was taken immediately to MR. IMPRESSION: Technically successful right shoulder injection for MRI. Provider: Dawayne Cirri  Mr Extrem Up Jt*r* W/cm  09/17/2009  Clinical Data: Right shoulder pain.  Prior shoulder dislocation.  MRI ARTHROGRAM OF THE RIGHT SHOULDER WITH INTRA-ARTICULAR CONTRAST  Technique:  Multiplanar, multisequence MR imaging of the right shoulder was performed following injection of dilute gadolinium contrast medium into the right glenohumeral joint.  The patient was unable to assume the ABER position.  Comparison: 08/20/2009  Findings:  Supraspinatus, infraspinatus, and subscapularis tendons appear intact.  There is abnormal contrast extension in the superior labrum compatible with a mildly displaced SLAP tear.  There is osseous edema along the Hill-Sachs impaction site in the posterior superior humeral head.  On images 12-16 of series 2, I am highly suspicious for a Bankart tear.  The intra-articular portion of the long head of the biceps appears intact.  Acromioclavicular alignment appears within  normal limits. The acromial undersurface is type 2 (curved).  No free osteochondral fragment is identified.  IMPRESSION:  1.  Hill-Sachs impaction with Bankart tear of the anterior inferior labrum. 2.  There is also abnormal linear signal in the superior labrum compatible with mildly displaced SLAP tear.  This extends to the biceps anchor. Provider: [place]:   Seyed was seen today for adhd.  Diagnoses and all orders for this visit:  Attention deficit hyperactivity disorder (ADHD), predominantly inattentive type -     Discontinue: amphetamine-dextroamphetamine (ADDERALL) 10 MG tablet; Take 1 tablet (10 mg total) by mouth 2 (two) times daily. Fill on or after 01/02/15 -     Discontinue: amphetamine-dextroamphetamine (ADDERALL) 10 MG tablet; Take 1 tablet (10 mg total) by mouth 2 (two) times daily. Fill on or after 02/01/15 -     Discontinue: amphetamine-dextroamphetamine (ADDERALL) 10 MG tablet; Take 1 tablet (10 mg total) by mouth 2 (two) times daily. Fill on or after 03/04/15 -     amphetamine-dextroamphetamine (ADDERALL) 10 MG tablet; Take 1 tablet (10 mg total) by mouth 2 (two) times daily. Fill on or after 02/29/17  Attention deficit disorder -     Discontinue: amphetamine-dextroamphetamine (ADDERALL) 10 MG tablet; Take  1 tablet (10 mg total) by mouth 2 (two) times daily. Fill on or after 01/02/15 -     Discontinue: amphetamine-dextroamphetamine (ADDERALL) 10 MG tablet; Take 1 tablet (10 mg total) by mouth 2 (two) times daily. Fill on or after 02/01/15 -     Discontinue: amphetamine-dextroamphetamine (ADDERALL) 10 MG tablet; Take 1 tablet (10 mg total) by mouth 2 (two) times daily. Fill on or after 03/04/15 -     amphetamine-dextroamphetamine (ADDERALL) 10 MG tablet; Take 1 tablet (10 mg total) by mouth 2 (two) times daily. Fill on or after 02/29/17   I have discontinued Mr. Mcmann's methylPREDNIsolone, amphetamine-dextroamphetamine, amphetamine-dextroamphetamine,  and amphetamine-dextroamphetamine. I have also changed his amphetamine-dextroamphetamine. Additionally, I am having him maintain his omeprazole, imiquimod, and cetirizine-pseudoephedrine.  Meds ordered this encounter  Medications  . DISCONTD: amphetamine-dextroamphetamine (ADDERALL) 10 MG tablet    Sig: Take 1 tablet (10 mg total) by mouth 2 (two) times daily. Fill on or after 01/02/15    Dispense:  60 tablet    Refill:  0  . DISCONTD: amphetamine-dextroamphetamine (ADDERALL) 10 MG tablet    Sig: Take 1 tablet (10 mg total) by mouth 2 (two) times daily. Fill on or after 02/01/15    Dispense:  60 tablet    Refill:  0  . DISCONTD: amphetamine-dextroamphetamine (ADDERALL) 10 MG tablet    Sig: Take 1 tablet (10 mg total) by mouth 2 (two) times daily. Fill on or after 03/04/15    Dispense:  60 tablet    Refill:  0  . amphetamine-dextroamphetamine (ADDERALL) 10 MG tablet    Sig: Take 1 tablet (10 mg total) by mouth 2 (two) times daily. Fill on or after 02/29/17    Dispense:  60 tablet    Refill:  0     Follow-up: Return in about 4 months (around 05/02/2015).  Scarlette Calico, MD

## 2015-09-19 ENCOUNTER — Ambulatory Visit (INDEPENDENT_AMBULATORY_CARE_PROVIDER_SITE_OTHER): Payer: BLUE CROSS/BLUE SHIELD | Admitting: Internal Medicine

## 2015-09-19 ENCOUNTER — Encounter: Payer: Self-pay | Admitting: Internal Medicine

## 2015-09-19 VITALS — BP 136/88 | HR 70 | Temp 98.3°F | Resp 16 | Ht 69.0 in | Wt 208.0 lb

## 2015-09-19 DIAGNOSIS — F900 Attention-deficit hyperactivity disorder, predominantly inattentive type: Secondary | ICD-10-CM | POA: Diagnosis not present

## 2015-09-19 DIAGNOSIS — Z23 Encounter for immunization: Secondary | ICD-10-CM | POA: Diagnosis not present

## 2015-09-19 DIAGNOSIS — Z0000 Encounter for general adult medical examination without abnormal findings: Secondary | ICD-10-CM | POA: Diagnosis not present

## 2015-09-19 DIAGNOSIS — F988 Other specified behavioral and emotional disorders with onset usually occurring in childhood and adolescence: Secondary | ICD-10-CM

## 2015-09-19 MED ORDER — AMPHETAMINE-DEXTROAMPHETAMINE 10 MG PO TABS: 10.0000 mg | ORAL_TABLET | Freq: Two times a day (BID) | ORAL | 0 refills | Status: DC

## 2015-09-19 NOTE — Progress Notes (Signed)
Subjective:  Patient ID: Kenneth Haney, male    DOB: [birth year]  Age: 35 y.o. MRN: [id number]  CC: No chief complaint on file.   HPI Kenneth Haney presents for a CPX.   Pt states ADD status overall stable on current meds with overall good compliance and tolerability, and good effectiveness with respect to ability for concentration and task completion.  Outpatient Medications Prior to Visit  Medication Sig Dispense Refill  . amphetamine-dextroamphetamine (ADDERALL) 10 MG tablet Take 1 tablet (10 mg total) by mouth 2 (two) times daily. Fill on or after 02/29/17 60 tablet 0  . cetirizine-pseudoephedrine (ZYRTEC-D) 5-120 MG per tablet Take 1 tablet by mouth 2 (two) times daily. 60 tablet 1  . imiquimod (ALDARA) 5 % cream Apply topically 3 (three) times a week. 12 each 1  . omeprazole (PRILOSEC) 20 MG capsule Take 1 capsule (20 mg total) by mouth daily. 48 capsule 0   No facility-administered medications prior to visit.     ROS Review of Systems  Constitutional: Negative.  Negative for appetite change, chills, fatigue and unexpected weight change.  HENT: Negative.  Negative for sinus pressure and trouble swallowing.   Eyes: Negative.   Respiratory: Negative.  Negative for cough, choking, chest tightness, shortness of breath and stridor.   Cardiovascular: Negative.  Negative for chest pain, palpitations and leg swelling.  Gastrointestinal: Negative.  Negative for abdominal pain, constipation, diarrhea, nausea and vomiting.  Endocrine: Negative.   Genitourinary: Negative.   Musculoskeletal: Negative.  Negative for back pain and myalgias.  Skin: Negative.  Negative for color change and rash.  Allergic/Immunologic: Negative.   Neurological: Negative.   Hematological: Negative.  Negative for adenopathy. Does not bruise/bleed easily.  Psychiatric/Behavioral: Positive for decreased concentration. Negative for behavioral problems, confusion, dysphoric mood, self-injury and suicidal ideas.  The patient is not nervous/anxious and is not hyperactive.     Objective:  BP 136/88 (BP Location: Left Arm, Patient Position: Sitting, Cuff Size: Normal)   Pulse 70   Temp 98.3 F (36.8 C) (Oral)   Ht 5\' 9"  (1.753 m)   Wt 208 lb (94.3 kg)   SpO2 97%   BMI 30.72 kg/m   BP Readings from Last 3 Encounters:  09/19/15 136/88  01/02/15 124/80  05/04/14 130/82    Wt Readings from Last 3 Encounters:  09/19/15 208 lb (94.3 kg)  01/02/15 205 lb (93 kg)  05/04/14 199 lb (90.3 kg)    Physical Exam  Constitutional: He is oriented to person, place, and time. No distress.  HENT:  Mouth/Throat: Oropharynx is clear and moist. No oropharyngeal exudate.  Eyes: Conjunctivae are normal. Right eye exhibits no discharge. Left eye exhibits no discharge. No scleral icterus.  Neck: Normal range of motion. Neck supple. No JVD present. No tracheal deviation present. No thyromegaly present.  Cardiovascular: Normal rate, regular rhythm, normal heart sounds and intact distal pulses.  Exam reveals no gallop and no friction rub.   No murmur heard. Pulmonary/Chest: Effort normal and breath sounds normal. No stridor. No respiratory distress. He has no wheezes. He has no rales. He exhibits no tenderness.  Abdominal: Soft. Bowel sounds are normal. He exhibits no distension and no mass. There is no tenderness. There is no rebound and no guarding. Hernia confirmed negative in the right inguinal area and confirmed negative in the left inguinal area.  Genitourinary: Testes normal and penis normal. Right testis shows no mass, no swelling and no tenderness. Right testis is descended. Left testis shows no mass, no  swelling and no tenderness. Left testis is descended. Circumcised. No penile erythema or penile tenderness. No discharge found.  Musculoskeletal: Normal range of motion. He exhibits no edema, tenderness or deformity.  Lymphadenopathy:    He has no cervical adenopathy.       Right: No inguinal adenopathy  present.       Left: No inguinal adenopathy present.  Neurological: He is oriented to person, place, and time.  Skin: Skin is warm and dry. No rash noted. He is not diaphoretic. No erythema. No pallor.  Psychiatric: He has a normal mood and affect. His behavior is normal. Judgment and thought content normal.  Vitals reviewed.   Lab Results  Component Value Date   WBC 8.5 06/11/2012   HGB 14.4 06/11/2012   HCT 42.1 06/11/2012   PLT 261.0 06/11/2012   GLUCOSE 91 06/11/2012   CHOL 282 (H) 06/11/2012   TRIG 281.0 (H) 06/11/2012   HDL 42.20 06/11/2012   LDLDIRECT 191.3 06/11/2012   ALT 39 06/11/2012   AST 24 06/11/2012   NA 134 (L) 06/11/2012   K 3.8 06/11/2012   CL 101 06/11/2012   CREATININE 1.1 06/11/2012   BUN 16 06/11/2012   CO2 24 06/11/2012   TSH 1.35 06/11/2012    Dg Fluoro Guide Ndl Plc/bx  Result Date: 09/17/2009 Clinical Data:  Pain.  Dislocation.  Assess for labral tear.  Fluoroscopy Time: 32 seconds  RIGHT SHOULDER INJECTION UNDER FLUOROSCOPY  Technique:  The skin overlying the right shoulder joint was cleansed with Betadine, draped in the usual sterile fashion, and infiltrated locally with 1% Lidocaine.  A 22 gauge spinal needle was advanced to the inferomedial margin of the humeral head on one pass under intermittent fluoroscopy.    A mixture of 0.1 ml Multihance 20 ml of dilute Conray 60 was then used to fill the right shoulder joint.  No apparent complication.  The patient was taken immediately to MR. IMPRESSION: Technically successful right shoulder injection for MRI. Provider: Dawayne Cirri  Mr Extrem Up Jt*r* W/cm  Result Date: 09/17/2009 Clinical Data: Right shoulder pain.  Prior shoulder dislocation.  MRI ARTHROGRAM OF THE RIGHT SHOULDER WITH INTRA-ARTICULAR CONTRAST  Technique:  Multiplanar, multisequence MR imaging of the right shoulder was performed following injection of dilute gadolinium contrast medium into the right glenohumeral joint.  The patient was  unable to assume the ABER position.  Comparison: 08/20/2009  Findings:  Supraspinatus, infraspinatus, and subscapularis tendons appear intact.  There is abnormal contrast extension in the superior labrum compatible with a mildly displaced SLAP tear.  There is osseous edema along the Hill-Sachs impaction site in the posterior superior humeral head.  On images 12-16 of series 2, I am highly suspicious for a Bankart tear.  The intra-articular portion of the long head of the biceps appears intact.  Acromioclavicular alignment appears within normal limits. The acromial undersurface is type 2 (curved).  No free osteochondral fragment is identified.  IMPRESSION:  1.  Hill-Sachs impaction with Bankart tear of the anterior inferior labrum. 2.  There is also abnormal linear signal in the superior labrum compatible with mildly displaced SLAP tear.  This extends to the biceps anchor. Provider: [place]:   Diagnoses and all orders for this visit:  Routine general medical examination at a health care facility- Exam completed, labs ordered and reviewed, vaccines reviewed and updated, patient education material was given. -     Lipid panel; Future -     Comprehensive metabolic  panel; Future -     CBC with Differential/Platelet; Future -     TSH; Future -     Urinalysis, Routine w reflex microscopic (not at [facility]); Future -     HIV antibody; Future  Attention deficit disorder  Attention deficit hyperactivity disorder (ADHD), predominantly inattentive type- he is doing well at the current dose of Adderall, will continue. -     Discontinue: amphetamine-dextroamphetamine (ADDERALL) 10 MG tablet; Take 1 tablet (10 mg total) by mouth 2 (two) times daily. Fill on or after 09/19/15 -     Discontinue: amphetamine-dextroamphetamine (ADDERALL) 10 MG tablet; Take 1 tablet (10 mg total) by mouth 2 (two) times daily. Fill on or after 10/20/15 -     Discontinue: amphetamine-dextroamphetamine (ADDERALL)  10 MG tablet; Take 1 tablet (10 mg total) by mouth 2 (two) times daily. Fill on or after 11/19/15 -     amphetamine-dextroamphetamine (ADDERALL) 10 MG tablet; Take 1 tablet (10 mg total) by mouth 2 (two) times daily. Fill on or after 12/20/15   I have discontinued Mr. Basu's omeprazole, imiquimod, cetirizine-pseudoephedrine, amphetamine-dextroamphetamine, amphetamine-dextroamphetamine, and amphetamine-dextroamphetamine. I have also changed his amphetamine-dextroamphetamine.  Meds ordered this encounter  Medications  . DISCONTD: amphetamine-dextroamphetamine (ADDERALL) 10 MG tablet    Sig: Take 1 tablet (10 mg total) by mouth 2 (two) times daily. Fill on or after 09/19/15    Dispense:  60 tablet    Refill:  0  . DISCONTD: amphetamine-dextroamphetamine (ADDERALL) 10 MG tablet    Sig: Take 1 tablet (10 mg total) by mouth 2 (two) times daily. Fill on or after 10/20/15    Dispense:  60 tablet    Refill:  0  . DISCONTD: amphetamine-dextroamphetamine (ADDERALL) 10 MG tablet    Sig: Take 1 tablet (10 mg total) by mouth 2 (two) times daily. Fill on or after 11/19/15    Dispense:  60 tablet    Refill:  0  . amphetamine-dextroamphetamine (ADDERALL) 10 MG tablet    Sig: Take 1 tablet (10 mg total) by mouth 2 (two) times daily. Fill on or after 12/20/15    Dispense:  60 tablet    Refill:  0     Follow-up: No Follow-up on file.  Scarlette Calico, MD

## 2015-09-19 NOTE — Patient Instructions (Signed)

## 2015-09-19 NOTE — Progress Notes (Signed)
Pre visit review using our clinic review tool, if applicable. No additional management support is needed unless otherwise documented below in the visit note. 

## 2016-06-23 ENCOUNTER — Telehealth: Payer: Self-pay | Admitting: Internal Medicine

## 2016-06-23 NOTE — Telephone Encounter (Signed)
Pt called to make an appointment with Dr Ronnald Ramp for a medication refill and was told he would need a lab check also. Can these labs be put in so that he can have them done fasting before his appointment?

## 2016-06-23 NOTE — Telephone Encounter (Signed)
We would rather wait until the appt.

## 2016-06-24 NOTE — Telephone Encounter (Signed)
Informed pt .

## 2016-06-26 ENCOUNTER — Other Ambulatory Visit: Payer: BLUE CROSS/BLUE SHIELD

## 2016-06-26 ENCOUNTER — Ambulatory Visit (INDEPENDENT_AMBULATORY_CARE_PROVIDER_SITE_OTHER): Payer: BLUE CROSS/BLUE SHIELD | Admitting: Internal Medicine

## 2016-06-26 ENCOUNTER — Other Ambulatory Visit (INDEPENDENT_AMBULATORY_CARE_PROVIDER_SITE_OTHER): Payer: BLUE CROSS/BLUE SHIELD

## 2016-06-26 ENCOUNTER — Ambulatory Visit (INDEPENDENT_AMBULATORY_CARE_PROVIDER_SITE_OTHER)
Admission: RE | Admit: 2016-06-26 | Discharge: 2016-06-26 | Disposition: A | Discharge status: Home / Self Care | Payer: BLUE CROSS/BLUE SHIELD | Source: Ambulatory Visit | Attending: Internal Medicine | Admitting: Internal Medicine

## 2016-06-26 ENCOUNTER — Encounter: Payer: Self-pay | Admitting: Internal Medicine

## 2016-06-26 VITALS — BP 124/84 | HR 83 | Temp 98.4°F | Resp 16 | Ht 69.0 in | Wt 203.5 lb

## 2016-06-26 DIAGNOSIS — R10814 Left lower quadrant abdominal tenderness: Secondary | ICD-10-CM

## 2016-06-26 DIAGNOSIS — K5900 Constipation, unspecified: Secondary | ICD-10-CM | POA: Diagnosis not present

## 2016-06-26 DIAGNOSIS — N200 Calculus of kidney: Secondary | ICD-10-CM | POA: Insufficient documentation

## 2016-06-26 DIAGNOSIS — R3129 Other microscopic hematuria: Secondary | ICD-10-CM

## 2016-06-26 LAB — CBC WITH DIFFERENTIAL/PLATELET
BASOS PCT: 0.4 % (ref 0.0–3.0)
Basophils Absolute: 0 10*3/uL (ref 0.0–0.1)
EOS ABS: 0.1 10*3/uL (ref 0.0–0.7)
EOS PCT: 0.8 % (ref 0.0–5.0)
HCT: 42 % (ref 39.0–52.0)
Hemoglobin: 14.4 g/dL (ref 13.0–17.0)
Lymphocytes Relative: 15.4 % (ref 12.0–46.0)
Lymphs Abs: 1.5 10*3/uL (ref 0.7–4.0)
MCHC: 34.3 g/dL (ref 30.0–36.0)
MCV: 86.5 fl (ref 78.0–100.0)
Monocytes Absolute: 0.8 10*3/uL (ref 0.1–1.0)
Monocytes Relative: 8.3 % (ref 3.0–12.0)
NEUTROS ABS: 7.1 10*3/uL (ref 1.4–7.7)
Neutrophils Relative %: 75.1 % (ref 43.0–77.0)
PLATELETS: 259 10*3/uL (ref 150.0–400.0)
RBC: 4.85 Mil/uL (ref 4.22–5.81)
RDW: 13.8 % (ref 11.5–15.5)
WBC: 9.4 10*3/uL (ref 4.0–10.5)

## 2016-06-26 LAB — COMPREHENSIVE METABOLIC PANEL
ALT: 30 U/L (ref 0–53)
AST: 17 U/L (ref 0–37)
Albumin: 4.4 g/dL (ref 3.5–5.2)
Alkaline Phosphatase: 68 U/L (ref 39–117)
BUN: 16 mg/dL (ref 6–23)
CHLORIDE: 100 meq/L (ref 96–112)
CO2: 28 meq/L (ref 19–32)
CREATININE: 1.17 mg/dL (ref 0.40–1.50)
Calcium: 9.6 mg/dL (ref 8.4–10.5)
GFR: 75.78 mL/min (ref >60.00)
GLUCOSE: 90 mg/dL (ref 70–99)
Potassium: 3.6 mEq/L (ref 3.5–5.1)
SODIUM: 138 meq/L (ref 135–145)
Total Bilirubin: 0.4 mg/dL (ref 0.2–1.2)
Total Protein: 7.5 g/dL (ref 6.0–8.3)

## 2016-06-26 LAB — URINALYSIS, ROUTINE W REFLEX MICROSCOPIC
Bilirubin Urine: NEGATIVE
KETONES UR: NEGATIVE
Leukocytes, UA: NEGATIVE
Nitrite: NEGATIVE
PH: 7 (ref 5.0–8.0)
Specific Gravity, Urine: 1.01 (ref 1.000–1.030)
Total Protein, Urine: NEGATIVE
URINE GLUCOSE: NEGATIVE
Urobilinogen, UA: 0.2 (ref 0.0–1.0)
WBC UA: NONE SEEN (ref 0–?)

## 2016-06-26 LAB — AMYLASE: AMYLASE: 53 U/L (ref 27–131)

## 2016-06-26 LAB — LIPASE: LIPASE: 19 U/L (ref 11.0–59.0)

## 2016-06-26 LAB — C-REACTIVE PROTEIN: CRP: 2.1 mg/dL (ref 0.5–20.0)

## 2016-06-26 MED ORDER — PROMETHAZINE HCL 12.5 MG PO TABS: 12.5000 mg | ORAL_TABLET | Freq: Three times a day (TID) | ORAL | 0 refills | Status: DC | PRN

## 2016-06-26 MED ORDER — OXYCODONE-ACETAMINOPHEN 7.5-325 MG PO TABS: 1.0000 | ORAL_TABLET | Freq: Three times a day (TID) | ORAL | 0 refills | Status: DC | PRN

## 2016-06-26 NOTE — Patient Instructions (Addendum)
Kidney Stones  Kidney stones (urolithiasis) are solid, rock-like deposits that form inside of the organs that make urine (kidneys). A kidney stone may form in a kidney and move into the bladder, where it can cause intense pain and block the flow of urine. Kidney stones are created when high levels of certain minerals are found in the urine. They are usually passed through urination, but in some cases, medical treatment may be needed to remove them.  What are the causes?  Kidney stones may be caused by:  · A condition in which certain glands produce too much parathyroid hormone (primary hyperparathyroidism), which causes too much calcium buildup in the blood.  · Buildup of uric acid crystals in the bladder (hyperuricosuria). Uric acid is a chemical that the body produces when you eat certain foods. It usually exits the body in the urine.  · Narrowing (stricture) of one or both of the tubes that drain urine from the kidneys to the bladder (ureters).  · A kidney blockage that is present at birth (congenital obstruction).  · Past surgery on the kidney or the ureters, such as gastric bypass surgery.    What increases the risk?  The following factors make you more likely to develop kidney stones:  · Having had a kidney stone in the past.  · Having a family history of kidney stones.  · Not drinking enough water.  · Eating a diet that is high in protein, salt (sodium), or sugar.  · Being overweight or obese.    What are the signs or symptoms?  Symptoms of a kidney stone may include:  · Nausea.  · Vomiting.  · Blood in the urine (hematuria).  · Pain in the side of the abdomen, right below the ribs (flank pain). Pain usually spreads (radiates) to the groin.  · Needing to urinate frequently or urgently.    How is this diagnosed?  This condition may be diagnosed based on:  · Your medical history.  · A physical exam.  · Blood tests.  · Urine tests.  · CT scan.  · Abdominal X-ray.  · A procedure to examine the inside of the  bladder (cystoscopy).    How is this treated?  Treatment for kidney stones depends on the size, location, and makeup of the stones. Treatment may involve:  · Analyzing your urine before and after you pass the stone through urination.  · Being monitored at the hospital until you pass the stone through urination.  · Increasing your fluid intake and decreasing the amount of calcium and protein in your diet.  · A procedure to break up kidney stones in the bladder using:  ? A focused beam of light (laser therapy).  ? Shock waves (extracorporeal shock wave lithotripsy).  · Surgery to remove kidney stones. This may be needed if you have severe pain or have stones that block your urinary tract.    Follow these instructions at home:  Eating and drinking     · Drink enough fluid to keep your urine clear or pale yellow. This will help you to pass the kidney stone.  · If directed, change your diet. This may include:  ? Limiting how much sodium you eat.  ? Eating more fruits and vegetables.  ? Limiting how much meat, poultry, fish, and eggs you eat.  · Follow instructions from your health care provider about eating or drinking restrictions.  General instructions   · Collect urine samples as told by your health care   provider. You may need to collect a urine sample:  ? 24 hours after you pass the stone.  ? 8-12 weeks after passing the kidney stone, and every 6-12 months after that.  · Strain your urine every time you urinate, for as long as directed. Use the strainer that your health care provider recommends.  · Do not throw out the kidney stone after passing it. Keep the stone so it can be tested by your health care provider. Testing the makeup of your kidney stone may help prevent you from getting kidney stones in the future.  · Take over-the-counter and prescription medicines only as told by your health care provider.  · Keep all follow-up visits as told by your health care provider. This is important. You may need follow-up  X-rays or ultrasounds to make sure that your stone has passed.  How is this prevented?  To prevent another kidney stone:  · Drink enough fluid to keep your urine clear or pale yellow. This is the best way to prevent kidney stones.  · Eat a healthy diet and follow recommendations from your health care provider about foods to avoid. You may be instructed to eat a low-protein diet. Recommendations vary depending on the type of kidney stone that you have.  · Maintain a healthy weight.    Contact a health care provider if:  · You have pain that gets worse or does not get better with medicine.  Get help right away if:  · You have a fever or chills.  · You develop severe pain.  · You develop new abdominal pain.  · You faint.  · You are unable to urinate.  This information is not intended to replace advice given to you by your health care provider. Make sure you discuss any questions you have with your health care provider.  Document Released: 01/20/2005 Document Revised: 08/10/2015 Document Reviewed: 07/06/2015  [employer] Interactive Patient Education © 2017 [employer]

## 2016-06-26 NOTE — Progress Notes (Signed)
Subjective:  Patient ID: Kenneth Haney, male    DOB: 05/27/1980  Age: 36 y.o. MRN: [id number]  CC: Abdominal Pain   HPI [name] presents for a 1 day history of left lower quadrant abdominal pain. He tells me he has had this happen about once a year for the last few years. He will have abdominal pain that will subside over the course of a day or 2. He tells me the pain radiates into his left groin and he has a pressure sensation when he is urinating. He denies nausea, vomiting, fever, chills, loss of appetite, lymphadenopathy, rash, diarrhea, constipation.  Outpatient Medications Prior to Visit  Medication Sig Dispense Refill  . amphetamine-dextroamphetamine (ADDERALL) 10 MG tablet Take 1 tablet (10 mg total) by mouth 2 (two) times daily. Fill on or after 12/20/15 60 tablet 0   No facility-administered medications prior to visit.     ROS Review of Systems  Constitutional: Negative.  Negative for chills, diaphoresis, fatigue and fever.  HENT: Negative.   Eyes: Negative.   Respiratory: Negative.  Negative for cough and chest tightness.   Cardiovascular: Negative.  Negative for chest pain and leg swelling.  Gastrointestinal: Positive for abdominal pain. Negative for abdominal distention, anal bleeding, blood in stool, constipation, diarrhea, nausea, rectal pain and vomiting.  Genitourinary: Negative for decreased urine volume, difficulty urinating, discharge, dysuria, flank pain, frequency, genital sores, hematuria, penile pain, penile swelling, scrotal swelling, testicular pain and urgency.  Musculoskeletal: Negative.  Negative for back pain.  Skin: Negative.  Negative for color change and rash.  Allergic/Immunologic: Negative.   Neurological: Negative.   Hematological: Negative for adenopathy. Does not bruise/bleed easily.  Psychiatric/Behavioral: Negative.     Objective:  BP 124/84 (BP Location: Left Arm, Patient Position: Sitting, Cuff Size: Large)   Pulse 83   Temp  98.4 F (36.9 C) (Oral)   Resp 16   Ht 5\' 9"  (1.753 m)   Wt 203 lb 8 oz (92.3 kg)   SpO2 99%   BMI 30.05 kg/m   BP Readings from Last 3 Encounters:  06/26/16 124/84  09/19/15 136/88  01/02/15 124/80    Wt Readings from Last 3 Encounters:  06/26/16 203 lb 8 oz (92.3 kg)  09/19/15 208 lb (94.3 kg)  01/02/15 205 lb (93 kg)    Physical Exam  Constitutional:  Non-toxic appearance. He does not have a sickly appearance. He does not appear ill. No distress.  HENT:  Mouth/Throat: Oropharynx is clear and moist. No oropharyngeal exudate.  Eyes: Conjunctivae are normal. Right eye exhibits no discharge. Left eye exhibits no discharge. No scleral icterus.  Neck: Normal range of motion. Neck supple.  Cardiovascular: Normal rate, regular rhythm, normal heart sounds and intact distal pulses.  Exam reveals no gallop and no friction rub.   No murmur heard. Pulmonary/Chest: Effort normal and breath sounds normal. No respiratory distress. He has no wheezes. He has no rales. He exhibits no tenderness.  Abdominal: Soft. Normal appearance and bowel sounds are normal. He exhibits no shifting dullness, no distension and no mass. There is no hepatosplenomegaly. There is tenderness in the left lower quadrant. There is no rebound, no guarding and no CVA tenderness. Hernia confirmed negative in the right inguinal area and confirmed negative in the left inguinal area.  Genitourinary: Rectum normal, prostate normal, testes normal and penis normal. Rectal exam shows no external hemorrhoid, no internal hemorrhoid, no fissure, no mass, no tenderness, anal tone normal and guaiac negative stool. Prostate is not enlarged  and not tender. Right testis shows no mass, no swelling and no tenderness. Right testis is descended. Left testis shows no mass, no swelling and no tenderness. Left testis is descended. Circumcised. No penile erythema or penile tenderness. No discharge found.  Lymphadenopathy:       Right: No inguinal  adenopathy present.       Left: No inguinal adenopathy present.  Skin: He is not diaphoretic.  Vitals reviewed.   Lab Results  Component Value Date   WBC 9.4 06/26/2016   HGB 14.4 06/26/2016   HCT 42.0 06/26/2016   PLT 259.0 06/26/2016   GLUCOSE 90 06/26/2016   CHOL 282 (H) 06/11/2012   TRIG 281.0 (H) 06/11/2012   HDL 42.20 06/11/2012   LDLDIRECT 191.3 06/11/2012   ALT 30 06/26/2016   AST 17 06/26/2016   NA 138 06/26/2016   K 3.6 06/26/2016   CL 100 06/26/2016   CREATININE 1.17 06/26/2016   BUN 16 06/26/2016   CO2 28 06/26/2016   TSH 1.35 06/11/2012    Dg Fluoro Guide Ndl Plc/bx  Result Date: 09/17/2009 Clinical Data:  Pain.  Dislocation.  Assess for labral tear.  Fluoroscopy Time: 32 seconds  RIGHT SHOULDER INJECTION UNDER FLUOROSCOPY  Technique:  The skin overlying the right shoulder joint was cleansed with Betadine, draped in the usual sterile fashion, and infiltrated locally with 1% Lidocaine.  A 22 gauge spinal needle was advanced to the inferomedial margin of the humeral head on one pass under intermittent fluoroscopy.    A mixture of 0.1 ml Multihance 20 ml of dilute Conray 60 was then used to fill the right shoulder joint.  No apparent complication.  The patient was taken immediately to MR. IMPRESSION: Technically successful right shoulder injection for MRI. Provider: Dawayne Cirri  Mr Extrem Up Jt*r* W/cm  Result Date: 09/17/2009 Clinical Data: Right shoulder pain.  Prior shoulder dislocation.  MRI ARTHROGRAM OF THE RIGHT SHOULDER WITH INTRA-ARTICULAR CONTRAST  Technique:  Multiplanar, multisequence MR imaging of the right shoulder was performed following injection of dilute gadolinium contrast medium into the right glenohumeral joint.  The patient was unable to assume the ABER position.  Comparison: 08/20/2009  Findings:  Supraspinatus, infraspinatus, and subscapularis tendons appear intact.  There is abnormal contrast extension in the superior labrum compatible with a  mildly displaced SLAP tear.  There is osseous edema along the Hill-Sachs impaction site in the posterior superior humeral head.  On images 12-16 of series 2, I am highly suspicious for a Bankart tear.  The intra-articular portion of the long head of the biceps appears intact.  Acromioclavicular alignment appears within normal limits. The acromial undersurface is type 2 (curved).  No free osteochondral fragment is identified.  IMPRESSION:  1.  Hill-Sachs impaction with Bankart tear of the anterior inferior labrum. 2.  There is also abnormal linear signal in the superior labrum compatible with mildly displaced SLAP tear.  This extends to the biceps anchor. Provider: [place]:   Ilia was seen today for abdominal pain.  Diagnoses and all orders for this visit:  Left lower quadrant abdominal tenderness without rebound tenderness- he has left lower quadrant pain that radiates into his groin, his examination is positive for tenderness but there is no rebound tenderness. Plain film of the abdomen is normal. His labs are negative for any evidence of pancreatic pathology, or infectious process such as diverticulitis since his white cell count and CRP are normal, or intestinal ischemia since he has a  normal bicarbonate. However, his urinalysis is positive for trace amount of blood so I do think he is experiencing acute nephrolithiasis. Will treat the pain and get a renal CT done to look for stones, renal mass, hydronephrosis, etc. -     Lipase; Future -     Comprehensive metabolic panel; Future -     CBC with Differential/Platelet; Future -     Amylase; Future -     Urinalysis, Routine w reflex microscopic; Future -     C-reactive protein; Future -     DG Abd Acute W/Chest; Future -     CT RENAL STONE STUDY; Future -     promethazine (PHENERGAN) 12.5 MG tablet; Take 1 tablet (12.5 mg total) by mouth every 8 (eight) hours as needed for nausea or vomiting (tale if percocet causes  N/V). -     oxyCODONE-acetaminophen (PERCOCET) 7.5-325 MG tablet; Take 1 tablet by mouth every 8 (eight) hours as needed.  Other microscopic hematuria- as above -     CT RENAL STONE STUDY; Future -     promethazine (PHENERGAN) 12.5 MG tablet; Take 1 tablet (12.5 mg total) by mouth every 8 (eight) hours as needed for nausea or vomiting (tale if percocet causes N/V). -     oxyCODONE-acetaminophen (PERCOCET) 7.5-325 MG tablet; Take 1 tablet by mouth every 8 (eight) hours as needed.   I am having Mr. Kostick start on promethazine and oxyCODONE-acetaminophen. I am also having him maintain his amphetamine-dextroamphetamine.  Meds ordered this encounter  Medications  . promethazine (PHENERGAN) 12.5 MG tablet    Sig: Take 1 tablet (12.5 mg total) by mouth every 8 (eight) hours as needed for nausea or vomiting (tale if percocet causes N/V).    Dispense:  30 tablet    Refill:  0  . oxyCODONE-acetaminophen (PERCOCET) 7.5-325 MG tablet    Sig: Take 1 tablet by mouth every 8 (eight) hours as needed.    Dispense:  20 tablet    Refill:  0     Follow-up: Return in about 1 day (around 06/27/2016).  Scarlette Calico, MD

## 2016-07-08 ENCOUNTER — Other Ambulatory Visit (INDEPENDENT_AMBULATORY_CARE_PROVIDER_SITE_OTHER): Payer: BLUE CROSS/BLUE SHIELD

## 2016-07-08 ENCOUNTER — Ambulatory Visit (INDEPENDENT_AMBULATORY_CARE_PROVIDER_SITE_OTHER): Payer: BLUE CROSS/BLUE SHIELD | Admitting: Internal Medicine

## 2016-07-08 ENCOUNTER — Encounter: Payer: Self-pay | Admitting: Internal Medicine

## 2016-07-08 VITALS — BP 128/82 | HR 78 | Temp 98.7°F | Wt 207.0 lb

## 2016-07-08 DIAGNOSIS — I10 Essential (primary) hypertension: Secondary | ICD-10-CM | POA: Diagnosis not present

## 2016-07-08 DIAGNOSIS — F900 Attention-deficit hyperactivity disorder, predominantly inattentive type: Secondary | ICD-10-CM

## 2016-07-08 DIAGNOSIS — N200 Calculus of kidney: Secondary | ICD-10-CM | POA: Diagnosis not present

## 2016-07-08 DIAGNOSIS — Z0000 Encounter for general adult medical examination without abnormal findings: Secondary | ICD-10-CM

## 2016-07-08 DIAGNOSIS — A630 Anogenital (venereal) warts: Secondary | ICD-10-CM | POA: Diagnosis not present

## 2016-07-08 DIAGNOSIS — E785 Hyperlipidemia, unspecified: Secondary | ICD-10-CM | POA: Diagnosis not present

## 2016-07-08 DIAGNOSIS — E781 Pure hyperglyceridemia: Secondary | ICD-10-CM | POA: Diagnosis not present

## 2016-07-08 LAB — LIPID PANEL
CHOL/HDL RATIO: 7
Cholesterol: 278 mg/dL — ABNORMAL HIGH (ref 0–200)
HDL: 41 mg/dL (ref >39.00)
Triglycerides: 530 mg/dL — ABNORMAL HIGH (ref 0.0–149.0)

## 2016-07-08 LAB — TSH: TSH: 1.25 u[IU]/mL (ref 0.35–4.50)

## 2016-07-08 LAB — LDL CHOLESTEROL, DIRECT: Direct LDL: 150 mg/dL

## 2016-07-08 MED ORDER — AMPHETAMINE-DEXTROAMPHETAMINE 10 MG PO TABS: 10.0000 mg | ORAL_TABLET | Freq: Two times a day (BID) | ORAL | 0 refills | Status: DC

## 2016-07-08 NOTE — Patient Instructions (Signed)

## 2016-07-08 NOTE — Progress Notes (Signed)
Subjective:  Patient ID: Kenneth Haney, male    DOB: 23-Feb-1980  Age: 36 y.o. MRN: [id number]  CC: Annual Exam; Hyperlipidemia; and ADHD   HPI Kenneth Haney presents for a CPX.  Pt states ADD status overall stable on current meds with overall good compliance and tolerability, and good effectiveness with respect to ability for concentration and task completion.  He complains that there is a wart on the right shaft of his penis that he wants to have treated.  His abdominal pain has resolved. He's had no recent episodes of hematuria, fever, chills. He has decided not to do the renal CT scan to see if he had a kidney stone.  Outpatient Medications Prior to Visit  Medication Sig Dispense Refill  . amphetamine-dextroamphetamine (ADDERALL) 10 MG tablet Take 1 tablet (10 mg total) by mouth 2 (two) times daily. Fill on or after 12/20/15 60 tablet 0  . oxyCODONE-acetaminophen (PERCOCET) 7.5-325 MG tablet Take 1 tablet by mouth every 8 (eight) hours as needed. 20 tablet 0  . promethazine (PHENERGAN) 12.5 MG tablet Take 1 tablet (12.5 mg total) by mouth every 8 (eight) hours as needed for nausea or vomiting (tale if percocet causes N/V). 30 tablet 0   No facility-administered medications prior to visit.     ROS Review of Systems  Constitutional: Negative.  Negative for appetite change, chills, fatigue and fever.  HENT: Negative.   Eyes: Negative for visual disturbance.  Respiratory: Negative for cough, chest tightness, shortness of breath and wheezing.   Cardiovascular: Negative for chest pain, palpitations and leg swelling.  Gastrointestinal: Negative for abdominal pain, constipation, diarrhea, nausea and vomiting.  Endocrine: Negative.   Genitourinary: Positive for genital sores. Negative for difficulty urinating, discharge, dysuria, flank pain, hematuria, penile swelling, scrotal swelling and testicular pain.  Musculoskeletal: Negative.   Skin: Negative.   Allergic/Immunologic:  Negative.   Neurological: Negative.   Hematological: Negative for adenopathy. Does not bruise/bleed easily.  Psychiatric/Behavioral: Negative.  Negative for agitation, decreased concentration, dysphoric mood and suicidal ideas. The patient is not nervous/anxious.     Objective:  BP 128/82   Pulse 78   Temp 98.7 F (37.1 C)   Wt 207 lb (93.9 kg)   SpO2 100%   BMI 30.57 kg/m   BP Readings from Last 3 Encounters:  07/08/16 128/82  06/26/16 124/84  09/19/15 136/88    Wt Readings from Last 3 Encounters:  07/08/16 207 lb (93.9 kg)  06/26/16 203 lb 8 oz (92.3 kg)  09/19/15 208 lb (94.3 kg)    Physical Exam  Constitutional: He is oriented to person, place, and time. No distress.  HENT:  Mouth/Throat: Oropharynx is clear and moist. No oropharyngeal exudate.  Eyes: Conjunctivae are normal. Right eye exhibits no discharge. Left eye exhibits no discharge. No scleral icterus.  Neck: Normal range of motion. Neck supple. No JVD present. No thyromegaly present.  Cardiovascular: Normal rate and regular rhythm.  Exam reveals no friction rub.   No murmur heard. Pulmonary/Chest: Effort normal and breath sounds normal. No respiratory distress. He has no wheezes. He has no rales.  Abdominal: Soft. Bowel sounds are normal. He exhibits no distension and no mass. There is no tenderness. There is no rebound and no guarding.  Genitourinary: Penis normal. Circumcised. No penile erythema or penile tenderness. No discharge found.  Genitourinary Comments: There is a verrucous lesion at the base, right side of the penile shaft. I applied cryotherapy to it, 3 different cycles of 22nd freeze and 10 seconds  fall.  Musculoskeletal: Normal range of motion. He exhibits no edema, tenderness or deformity.  Lymphadenopathy:    He has no cervical adenopathy.  Neurological: He is alert and oriented to person, place, and time.  Skin: Skin is warm and dry. No rash noted. He is not diaphoretic. No erythema. No pallor.   Psychiatric: He has a normal mood and affect. His behavior is normal. Judgment and thought content normal.  Vitals reviewed.   Lab Results  Component Value Date   WBC 9.4 06/26/2016   HGB 14.4 06/26/2016   HCT 42.0 06/26/2016   PLT 259.0 06/26/2016   GLUCOSE 90 06/26/2016   CHOL 278 (H) 07/08/2016   TRIG (H) 07/08/2016    530.0 Triglyceride is over 400; calculations on Lipids are invalid.   HDL 41.00 07/08/2016   LDLDIRECT 150.0 07/08/2016   ALT 30 06/26/2016   AST 17 06/26/2016   NA 138 06/26/2016   K 3.6 06/26/2016   CL 100 06/26/2016   CREATININE 1.17 06/26/2016   BUN 16 06/26/2016   CO2 28 06/26/2016   TSH 1.25 07/08/2016    Dg Abd Acute W/chest  Result Date: 06/26/2016 CLINICAL DATA:  Left lower quadrant pain for 2 days.  Constipation. EXAM: DG ABDOMEN ACUTE W/ 1V CHEST COMPARISON:  None. FINDINGS: Single-view of the chest demonstrates clear lungs and normal heart size. No pneumothorax or pleural effusion. No bony abnormality. Two views of the abdomen show no free intraperitoneal air. The bowel gas pattern is nonobstructive. Moderately large stool burden noted. IMPRESSION: No acute abnormality. Moderately large stool burden. Electronically Signed   By: Kenneth Haney M.D.   On: 06/26/2016 10:31    Assessment & Plan:   Kenneth Haney was seen today for annual exam, hyperlipidemia and adhd.  Diagnoses and all orders for this visit:  Kidney stone on left side- his symptoms have resolved, at this time he does not want to pursue a renal CT scan. He will let me know if he develops any new symptoms.  Routine general medical examination at a health care facility- exam completed, labs ordered and reviewed, vaccines reviewed, patient education material was given. -     Lipid panel; Future  Attention deficit hyperactivity disorder (ADHD), predominantly inattentive type -     Discontinue: amphetamine-dextroamphetamine (ADDERALL) 10 MG tablet; Take 1 tablet (10 mg total) by mouth 2  (two) times daily. Fill on or after 07/08/16 -     Discontinue: amphetamine-dextroamphetamine (ADDERALL) 10 MG tablet; Take 1 tablet (10 mg total) by mouth 2 (two) times daily. Fill on or after 08/07/16 -     amphetamine-dextroamphetamine (ADDERALL) 10 MG tablet; Take 1 tablet (10 mg total) by mouth 2 (two) times daily. Fill on or after 09/07/16  Penile wart- treated with cryotherapy, he will return in 3 weeks to see if he needs another treatment  Hyperlipidemia with target LDL less than 130- his Framingham risk was not elevated so I do not recommend a statin at this time. -     TSH; Future  Essential hypertension, benign- his blood pressure is adequately well controlled. -     TSH; Future  Pure hyperglyceridemia- in addition to dietary and lifestyle modifications I recommend that he start taking and omega-3 fish oil since his triglyceride level is above 500 and will want to avoid the complication of pancreatitis. -     Icosapent Ethyl (VASCEPA) 1 g CAPS; Take 2 capsules by mouth 2 (two) times daily.   I have discontinued Mr. Otani's amphetamine-dextroamphetamine,  promethazine, oxyCODONE-acetaminophen, and amphetamine-dextroamphetamine. I have also changed his amphetamine-dextroamphetamine. Additionally, I am having him start on Icosapent Ethyl.  Meds ordered this encounter  Medications  . DISCONTD: amphetamine-dextroamphetamine (ADDERALL) 10 MG tablet    Sig: Take 1 tablet (10 mg total) by mouth 2 (two) times daily. Fill on or after 07/08/16    Dispense:  60 tablet    Refill:  0  . DISCONTD: amphetamine-dextroamphetamine (ADDERALL) 10 MG tablet    Sig: Take 1 tablet (10 mg total) by mouth 2 (two) times daily. Fill on or after 08/07/16    Dispense:  60 tablet    Refill:  0  . amphetamine-dextroamphetamine (ADDERALL) 10 MG tablet    Sig: Take 1 tablet (10 mg total) by mouth 2 (two) times daily. Fill on or after 09/07/16    Dispense:  60 tablet    Refill:  0  . Icosapent Ethyl  (VASCEPA) 1 g CAPS    Sig: Take 2 capsules by mouth 2 (two) times daily.    Dispense:  120 capsule    Refill:  11     Follow-up: Return in about 4 months (around 11/07/2016).  Scarlette Calico, MD

## 2016-07-09 ENCOUNTER — Telehealth: Payer: Self-pay | Admitting: Internal Medicine

## 2016-07-09 DIAGNOSIS — E781 Pure hyperglyceridemia: Secondary | ICD-10-CM | POA: Insufficient documentation

## 2016-07-09 MED ORDER — ICOSAPENT ETHYL 1 G PO CAPS: 2.0000 | ORAL_CAPSULE | Freq: Two times a day (BID) | ORAL | 11 refills | Status: DC

## 2016-07-09 NOTE — Telephone Encounter (Signed)
Pt informed. He will print the card from home.

## 2016-07-09 NOTE — Telephone Encounter (Signed)
He should come get a co-pay card

## 2016-07-09 NOTE — Telephone Encounter (Signed)
Pt called and stated he would like something else besides Icosapent Ethyl (VASCEPA) 1 g CAPS  It was $300 bucks.  Please send in alternative to [facility] on [address]

## 2016-07-10 ENCOUNTER — Other Ambulatory Visit: Payer: Self-pay | Admitting: Internal Medicine

## 2016-07-10 DIAGNOSIS — E781 Pure hyperglyceridemia: Secondary | ICD-10-CM

## 2016-07-10 MED ORDER — OMEGA-3-ACID ETHYL ESTERS 1 G PO CAPS: 2.0000 g | ORAL_CAPSULE | Freq: Two times a day (BID) | ORAL | 11 refills | Status: DC

## 2016-07-10 NOTE — Telephone Encounter (Signed)
Change to lovaza

## 2016-07-10 NOTE — Telephone Encounter (Signed)
Pt called and states even with the card it is $200 a month, he would like something else prescribed

## 2016-12-15 ENCOUNTER — Encounter: Payer: Self-pay | Admitting: Internal Medicine

## 2016-12-15 ENCOUNTER — Other Ambulatory Visit (INDEPENDENT_AMBULATORY_CARE_PROVIDER_SITE_OTHER): Payer: BLUE CROSS/BLUE SHIELD

## 2016-12-15 ENCOUNTER — Ambulatory Visit (INDEPENDENT_AMBULATORY_CARE_PROVIDER_SITE_OTHER): Payer: BLUE CROSS/BLUE SHIELD | Admitting: Internal Medicine

## 2016-12-15 VITALS — BP 120/84 | HR 63 | Temp 97.7°F | Resp 16 | Wt 192.0 lb

## 2016-12-15 DIAGNOSIS — E781 Pure hyperglyceridemia: Secondary | ICD-10-CM

## 2016-12-15 DIAGNOSIS — Z23 Encounter for immunization: Secondary | ICD-10-CM

## 2016-12-15 DIAGNOSIS — R1030 Lower abdominal pain, unspecified: Secondary | ICD-10-CM | POA: Diagnosis not present

## 2016-12-15 DIAGNOSIS — F900 Attention-deficit hyperactivity disorder, predominantly inattentive type: Secondary | ICD-10-CM

## 2016-12-15 DIAGNOSIS — E785 Hyperlipidemia, unspecified: Secondary | ICD-10-CM | POA: Diagnosis not present

## 2016-12-15 DIAGNOSIS — R10814 Left lower quadrant abdominal tenderness: Secondary | ICD-10-CM

## 2016-12-15 LAB — LIPID PANEL
CHOL/HDL RATIO: 6
Cholesterol: 253 mg/dL — ABNORMAL HIGH (ref 0–200)
HDL: 45.8 mg/dL (ref >39.00)
LDL CALC: 174 mg/dL — AB (ref 0–99)
NonHDL: 207.68
Triglycerides: 166 mg/dL — ABNORMAL HIGH (ref 0.0–149.0)
VLDL: 33.2 mg/dL (ref 0.0–40.0)

## 2016-12-15 MED ORDER — AMPHETAMINE-DEXTROAMPHETAMINE 10 MG PO TABS: 10.0000 mg | ORAL_TABLET | Freq: Two times a day (BID) | ORAL | 0 refills | Status: DC

## 2016-12-15 NOTE — Progress Notes (Signed)
Subjective:  Patient ID: Kenneth Haney, male    DOB: 07/29/1980  Age: 36 y.o. MRN: [id number]  CC: Abdominal Pain   HPI Ewing Fandino presents for concerns about recurrent episodes of left lower quadrant abdominal pain.  This has been happening intermittently for about 2 years.  He describes alternating episodes of constipation and diarrhea but most recently has suffered more from constipation.  The last episode of the pain was 2 weeks ago.  When the pain occurred he also had a low-grade fever.  He denies nausea, vomiting, loss of appetite, bloody stool, or cramping.  Pt states ADD status overall stable on current meds with overall good compliance and tolerability, and good effectiveness with respect to ability for concentration and task completion.  To lower his triglycerides he has worked on his lifestyle modifications and has lost weight.  He is also compliant with the omega-3 fish oils.  Outpatient Medications Prior to Visit  Medication Sig Dispense Refill  . omega-3 acid ethyl esters (LOVAZA) 1 g capsule Take 2 capsules (2 g total) by mouth 2 (two) times daily. 120 capsule 11  . amphetamine-dextroamphetamine (ADDERALL) 10 MG tablet Take 1 tablet (10 mg total) by mouth 2 (two) times daily. Fill on or after 09/07/16 60 tablet 0   No facility-administered medications prior to visit.     ROS Review of Systems  Constitutional: Negative.  Negative for appetite change, diaphoresis, fatigue and unexpected weight change.  HENT: Negative.   Eyes: Negative.  Negative for visual disturbance.  Respiratory: Negative.  Negative for cough, chest tightness, shortness of breath and wheezing.   Cardiovascular: Negative for chest pain, palpitations and leg swelling.  Gastrointestinal: Positive for abdominal pain and constipation. Negative for blood in stool, diarrhea, nausea and vomiting.  Endocrine: Negative.   Genitourinary: Negative.  Negative for difficulty urinating, dysuria, flank pain,  frequency, hematuria, penile swelling, scrotal swelling and testicular pain.  Musculoskeletal: Negative.  Negative for back pain and myalgias.  Skin: Negative.  Negative for color change and rash.  Allergic/Immunologic: Negative.   Neurological: Negative.  Negative for dizziness.  Hematological: Negative for adenopathy. Does not bruise/bleed easily.  Psychiatric/Behavioral: Positive for decreased concentration. Negative for agitation, behavioral problems, confusion, dysphoric mood, self-injury and sleep disturbance. The patient is not nervous/anxious and is not hyperactive.     Objective:  BP 120/84   Pulse 63   Temp 97.7 F (36.5 C) (Oral)   Resp 16   Wt 192 lb (87.1 kg)   SpO2 95%   BMI 28.35 kg/m   BP Readings from Last 3 Encounters:  12/15/16 120/84  07/08/16 128/82  06/26/16 124/84    Wt Readings from Last 3 Encounters:  12/15/16 192 lb (87.1 kg)  07/08/16 207 lb (93.9 kg)  06/26/16 203 lb 8 oz (92.3 kg)    Physical Exam  Constitutional: He is oriented to person, place, and time.  Non-toxic appearance. He does not have a sickly appearance. He does not appear ill. No distress.  HENT:  Mouth/Throat: Oropharynx is clear and moist. No oropharyngeal exudate.  Eyes: Conjunctivae are normal. Right eye exhibits no discharge. Left eye exhibits no discharge. No scleral icterus.  Neck: Normal range of motion. Neck supple. No JVD present. No thyromegaly present.  Cardiovascular: Normal rate, regular rhythm and intact distal pulses.  No murmur heard. Pulmonary/Chest: Effort normal and breath sounds normal. No respiratory distress. He has no wheezes. He has no rales. He exhibits no tenderness.  Abdominal: Soft. Normal appearance and bowel sounds  are normal. He exhibits no distension and no mass. There is no hepatosplenomegaly, splenomegaly or hepatomegaly. There is tenderness in the left lower quadrant. There is no rigidity, no rebound, no guarding, no CVA tenderness, no tenderness at  McBurney's point and negative Murphy's sign. No hernia. Hernia confirmed negative in the ventral area and confirmed negative in the right inguinal area.  Musculoskeletal: Normal range of motion. He exhibits no edema, tenderness or deformity.  Lymphadenopathy:    He has no cervical adenopathy.  Neurological: He is alert and oriented to person, place, and time.  Skin: Skin is warm and dry. No rash noted. He is not diaphoretic. No erythema. No pallor.  Vitals reviewed.   Lab Results  Component Value Date   WBC 9.4 06/26/2016   HGB 14.4 06/26/2016   HCT 42.0 06/26/2016   PLT 259.0 06/26/2016   GLUCOSE 90 06/26/2016   CHOL 253 (H) 12/15/2016   TRIG 166.0 (H) 12/15/2016   HDL 45.80 12/15/2016   LDLDIRECT 150.0 07/08/2016   LDLCALC 174 (H) 12/15/2016   ALT 30 06/26/2016   AST 17 06/26/2016   NA 138 06/26/2016   K 3.6 06/26/2016   CL 100 06/26/2016   CREATININE 1.17 06/26/2016   BUN 16 06/26/2016   CO2 28 06/26/2016   TSH 1.25 07/08/2016    Dg Abd Acute W/chest  Result Date: 06/26/2016 CLINICAL DATA:  Left lower quadrant pain for 2 days.  Constipation. EXAM: DG ABDOMEN ACUTE W/ 1V CHEST COMPARISON:  None. FINDINGS: Single-view of the chest demonstrates clear lungs and normal heart size. No pneumothorax or pleural effusion. No bony abnormality. Two views of the abdomen show no free intraperitoneal air. The bowel gas pattern is nonobstructive. Moderately large stool burden noted. IMPRESSION: No acute abnormality. Moderately large stool burden. Electronically Signed   By: Inge Rise M.D.   On: 06/26/2016 10:31    Assessment & Plan:   Dietrich was seen today for abdominal pain.  Diagnoses and all orders for this visit:  Need for influenza vaccination -     Flu Vaccine QUAD 6+ mos PF IM (Fluarix Quad PF)  Attention deficit hyperactivity disorder (ADHD), predominantly inattentive type- Will continue Adderall at the current dose. -     amphetamine-dextroamphetamine (ADDERALL) 10 MG  tablet; Take 1 tablet (10 mg total) 2 (two) times daily by mouth.  Hyperlipidemia with target LDL less than 130- He has a low Framingham risk score so I do not recommend that he start taking a statin for this. -     Lipid panel; Future  Pure hyperglyceridemia- marked improvement noted.  He was asked to continue with the current treatment plan. -     Lipid panel; Future  Left lower quadrant abdominal tenderness without rebound tenderness- I have asked him to undergo CT scan to screen for diverticulitis/diverticulosis/renal stone/mass. -     CT Abdomen Pelvis W Contrast; Future  Lower abdominal pain -     CT Abdomen Pelvis W Contrast; Future   I have changed Tajai Varone's amphetamine-dextroamphetamine. I am also having him maintain his omega-3 acid ethyl esters.  Meds ordered this encounter  Medications  . amphetamine-dextroamphetamine (ADDERALL) 10 MG tablet    Sig: Take 1 tablet (10 mg total) 2 (two) times daily by mouth.    Dispense:  60 tablet    Refill:  0     Follow-up: Return in about 3 weeks (around 01/05/2017).  Scarlette Calico, MD

## 2016-12-15 NOTE — Patient Instructions (Signed)

## 2016-12-17 ENCOUNTER — Inpatient Hospital Stay: Admission: RE | Admit: 2016-12-17 | Payer: BLUE CROSS/BLUE SHIELD | Source: Ambulatory Visit

## 2017-01-02 ENCOUNTER — Inpatient Hospital Stay: Admission: RE | Admit: 2017-01-02 | Payer: BLUE CROSS/BLUE SHIELD | Source: Ambulatory Visit

## 2017-01-29 ENCOUNTER — Encounter: Payer: Self-pay | Admitting: Internal Medicine

## 2017-02-10 ENCOUNTER — Encounter: Payer: Self-pay | Admitting: Internal Medicine

## 2017-02-11 NOTE — Telephone Encounter (Signed)
I did not receive the results

## 2017-02-25 ENCOUNTER — Encounter: Payer: Self-pay | Admitting: Internal Medicine

## 2017-02-27 ENCOUNTER — Encounter: Payer: Self-pay | Admitting: Internal Medicine

## 2017-03-03 ENCOUNTER — Encounter: Payer: Self-pay | Admitting: Internal Medicine

## 2017-03-03 ENCOUNTER — Ambulatory Visit (INDEPENDENT_AMBULATORY_CARE_PROVIDER_SITE_OTHER): Payer: BLUE CROSS/BLUE SHIELD | Admitting: Internal Medicine

## 2017-03-03 VITALS — BP 138/70 | HR 92 | Temp 98.7°F | Resp 16 | Ht 69.0 in | Wt 192.1 lb

## 2017-03-03 DIAGNOSIS — I10 Essential (primary) hypertension: Secondary | ICD-10-CM

## 2017-03-03 DIAGNOSIS — R0789 Other chest pain: Secondary | ICD-10-CM | POA: Diagnosis not present

## 2017-03-03 DIAGNOSIS — F418 Other specified anxiety disorders: Secondary | ICD-10-CM | POA: Diagnosis not present

## 2017-03-03 MED ORDER — VILAZODONE HCL 10 & 20 MG PO KIT: 1.0000 | PACK | Freq: Every day | ORAL | 0 refills | Status: DC

## 2017-03-03 NOTE — Progress Notes (Signed)
Subjective:  Patient ID: Kenneth Haney, male    DOB: 1980/07/29  Age: 37 y.o. MRN: [id number]  CC: Depression   HPI Kenneth Haney presents for concerns about insomnia and anxiety.  He tells me he is under quite a bit of stress now at work, he is getting ready to get married, and he and his fianc are buying a new home.  He complains of intermittent episodes of chest pressure and feeling like his chest is fluttering.  The symptoms keep him awake at night.  He has noted twitches and numbness in his arms and legs while he is trying to sleep.  The insomnia is so severe that he does not sleep even after he has had 40 or 50 mg of melatonin.  When asked to describe his heart sensation he says he feels like his heart has a balloon in it that is going to explode.  He feels abnormal sensation and pulses in his arms.  Outpatient Medications Prior to Visit  Medication Sig Dispense Refill  . omega-3 acid ethyl esters (LOVAZA) 1 g capsule Take 2 capsules (2 g total) by mouth 2 (two) times daily. 120 capsule 11  . amphetamine-dextroamphetamine (ADDERALL) 10 MG tablet Take 1 tablet (10 mg total) 2 (two) times daily by mouth. 60 tablet 0   No facility-administered medications prior to visit.     ROS Review of Systems  Constitutional: Negative for diaphoresis, fatigue and unexpected weight change.  HENT: Negative.   Eyes: Negative.  Negative for visual disturbance.  Respiratory: Negative for cough, chest tightness, shortness of breath and wheezing.   Cardiovascular: Positive for chest pain and palpitations. Negative for leg swelling.  Gastrointestinal: Negative.  Negative for abdominal pain, diarrhea, nausea and vomiting.  Genitourinary: Negative.   Musculoskeletal: Negative.  Negative for back pain and myalgias.  Skin: Negative.   Allergic/Immunologic: Negative.   Neurological: Negative.  Negative for dizziness, weakness and headaches.  Hematological: Negative.   Psychiatric/Behavioral: Positive  for decreased concentration, dysphoric mood and sleep disturbance. Negative for agitation, behavioral problems, confusion, self-injury and suicidal ideas. The patient is nervous/anxious. The patient is not hyperactive.     Objective:  BP 138/70 (BP Location: Left Arm, Patient Position: Sitting, Cuff Size: Normal)   Pulse 92   Temp 98.7 F (37.1 C) (Oral)   Resp 16   Ht _0  (1.753 m)   Wt 192 lb 1.3 oz (87.1 kg)   SpO2 98%   BMI 28.37 kg/m   BP Readings from Last 3 Encounters:  03/03/17 138/70  12/15/16 120/84  07/08/16 128/82    Wt Readings from Last 3 Encounters:  03/03/17 192 lb 1.3 oz (87.1 kg)  12/15/16 192 lb (87.1 kg)  07/08/16 207 lb (93.9 kg)    Physical Exam  Constitutional: He is oriented to person, place, and time. No distress.  HENT:  Mouth/Throat: Oropharynx is clear and moist. No oropharyngeal exudate.  Eyes: Conjunctivae are normal. Left eye exhibits no discharge. No scleral icterus.  Neck: Normal range of motion. Neck supple. No JVD present. No thyromegaly present.  Cardiovascular: Normal rate, regular rhythm and normal heart sounds. Exam reveals no gallop and no friction rub.  No murmur heard. EKG ---  Sinus  Bradycardia  -With rate variation  cv = 14. WITHIN NORMAL LIMITS  Pulmonary/Chest: Effort normal and breath sounds normal. No respiratory distress. He has no wheezes. He has no rales.  Abdominal: Soft. Bowel sounds are normal. He exhibits no distension and no mass.  Musculoskeletal:  Normal range of motion. He exhibits no edema.  Lymphadenopathy:    He has no cervical adenopathy.  Neurological: He is alert and oriented to person, place, and time.  Skin: He is not diaphoretic.  Psychiatric: His speech is normal and behavior is normal. Judgment normal. His mood appears anxious. His affect is not angry, not blunt, not labile and not inappropriate. He is not agitated, not aggressive, not slowed, not withdrawn and not combative. Cognition and memory  are normal. He exhibits a depressed mood. He expresses no homicidal and no suicidal ideation. He expresses no suicidal plans and no homicidal plans.  tearful He is attentive.  Vitals reviewed.   Lab Results  Component Value Date   WBC 9.4 06/26/2016   HGB 14.4 06/26/2016   HCT 42.0 06/26/2016   PLT 259.0 06/26/2016   GLUCOSE 90 06/26/2016   CHOL 253 (H) 12/15/2016   TRIG 166.0 (H) 12/15/2016   HDL 45.80 12/15/2016   LDLDIRECT 150.0 07/08/2016   LDLCALC 174 (H) 12/15/2016   ALT 30 06/26/2016   AST 17 06/26/2016   NA 138 06/26/2016   K 3.6 06/26/2016   CL 100 06/26/2016   CREATININE 1.17 06/26/2016   BUN 16 06/26/2016   CO2 28 06/26/2016   TSH 1.25 07/08/2016    Dg Abd Acute W/chest  Result Date: 06/26/2016 CLINICAL DATA:  Left lower quadrant pain for 2 days.  Constipation. EXAM: DG ABDOMEN ACUTE W/ 1V CHEST COMPARISON:  None. FINDINGS: Single-view of the chest demonstrates clear lungs and normal heart size. No pneumothorax or pleural effusion. No bony abnormality. Two views of the abdomen show no free intraperitoneal air. The bowel gas pattern is nonobstructive. Moderately large stool burden noted. IMPRESSION: No acute abnormality. Moderately large stool burden. Electronically Signed   By: Inge Rise M.D.   On: 06/26/2016 10:31    Assessment & Plan:   Kenneth Haney was seen today for depression.  Diagnoses and all orders for this visit:  Essential hypertension, benign- His blood pressure is adequately well controlled.  Chest tightness or pressure- His symptoms do not sound cardiac in nature.  His exam is unremarkable and the EKG is within normal limits.  Will treat for anxiety and depression. -     EKG 12-Lead  Anxiety with depression- Will discontinue Adderall.  Will start Viibryd and gradually increase the dose up to 40 mg.  He agrees to start psychotherapy. -     Vilazodone HCl (VIIBRYD STARTER PACK) 10 & 20 MG KIT; Take 1 tablet by mouth daily.   I have discontinued  Kenneth Haney amphetamine-dextroamphetamine. I am also having him start on Vilazodone HCl. Additionally, I am having him maintain his omega-3 acid ethyl esters.  Meds ordered this encounter  Medications  . Vilazodone HCl (VIIBRYD STARTER PACK) 10 & 20 MG KIT    Sig: Take 1 tablet by mouth daily.    Dispense:  1 kit    Refill:  0     Follow-up: Return in about 2 weeks (around 03/17/2017).  Scarlette Calico, MD

## 2017-03-03 NOTE — Patient Instructions (Signed)
Major Depressive Disorder, Adult Major depressive disorder (MDD) is a mental health condition. It may also be called clinical depression or unipolar depression. MDD usually causes feelings of sadness, hopelessness, or helplessness. MDD can also cause physical symptoms. It can interfere with work, school, relationships, and other everyday activities. MDD may be mild, moderate, or severe. It may occur once (single episode major depressive disorder) or it may occur multiple times (recurrent major depressive disorder). What are the causes? The exact cause of this condition is not known. MDD is most likely caused by a combination of things, which may include:  Genetic factors. These are traits that are passed along from parent to child.  Individual factors. Your personality, your behavior, and the way you handle your thoughts and feelings may contribute to MDD. This includes personality traits and behaviors learned from others.  Physical factors, such as: ? Differences in the part of your brain that controls emotion. This part of your brain may be different than it is in people who do not have MDD. ? Long-term (chronic) medical or psychiatric illnesses.  Social factors. Traumatic experiences or major life changes may play a role in the development of MDD.  What increases the risk? This condition is more likely to develop in women. The following factors may also make you more likely to develop MDD:  A family history of depression.  Troubled family relationships.  Abnormally low levels of certain brain chemicals.  Traumatic events in childhood, especially abuse or the loss of a parent.  Being under a lot of stress, or long-term stress, especially from upsetting life experiences or losses.  A history of: ? Chronic physical illness. ? Other mental health disorders. ? Substance abuse.  Poor living conditions.  Experiencing social exclusion or discrimination on a regular basis.  What are  the signs or symptoms? The main symptoms of MDD typically include:  Constant depressed or irritable mood.  Loss of interest in things and activities.  MDD symptoms may also include:  Sleeping or eating too much or too little.  Unexplained weight change.  Fatigue or low energy.  Feelings of worthlessness or guilt.  Difficulty thinking clearly or making decisions.  Thoughts of suicide or of harming others.  Physical agitation or weakness.  Isolation.  Severe cases of MDD may also occur with other symptoms, such as:  Delusions or hallucinations, in which you imagine things that are not real (psychotic depression).  Low-level depression that lasts at least a year (chronic depression or persistent depressive disorder).  Extreme sadness and hopelessness (melancholic depression).  Trouble speaking and moving (catatonic depression).  How is this diagnosed? This condition may be diagnosed based on:  Your symptoms.  Your medical history, including your mental health history. This may involve tests to evaluate your mental health. You may be asked questions about your lifestyle, including any drug and alcohol use, and how long you have had symptoms of MDD.  A physical exam.  Blood tests to rule out other conditions.  You must have a depressed mood and at least four other MDD symptoms most of the day, nearly every day in the same 2-week timeframe before your health care provider can confirm a diagnosis of MDD. How is this treated? This condition is usually treated by mental health professionals, such as psychologists, psychiatrists, and clinical social workers. You may need more than one type of treatment. Treatment may include:  Psychotherapy. This is also called talk therapy or counseling. Types of psychotherapy include: ? Cognitive behavioral   therapy (CBT). This type of therapy teaches you to recognize unhealthy feelings, thoughts, and behaviors, and replace them with  positive thoughts and actions. ? Interpersonal therapy (IPT). This helps you to improve the way you relate to and communicate with others. ? Family therapy. This treatment includes members of your family.  Medicine to treat anxiety and depression, or to help you control certain emotions and behaviors.  Lifestyle changes, such as: ? Limiting alcohol and drug use. ? Exercising regularly. ? Getting plenty of sleep. ? Making healthy eating choices. ? Spending more time outdoors.  Treatments involving stimulation of the brain can be used in situations with extremely severe symptoms, or when medicine or other therapies do not work over time. These treatments include electroconvulsive therapy, transcranial magnetic stimulation, and vagal nerve stimulation. Follow these instructions at home: Activity  Return to your normal activities as told by your health care provider.  Exercise regularly and spend time outdoors as told by your health care provider. General instructions  Take over-the-counter and prescription medicines only as told by your health care provider.  Do not drink alcohol. If you drink alcohol, limit your alcohol intake to no more than 1 drink a day for nonpregnant women and 2 drinks a day for men. One drink equals 12 oz of beer, 5 oz of wine, or 1 oz of hard liquor. Alcohol can affect any antidepressant medicines you are taking. Talk to your health care provider about your alcohol use.  Eat a healthy diet and get plenty of sleep.  Find activities that you enjoy doing, and make time to do them.  Consider joining a support group. Your health care provider may be able to recommend a support group.  Keep all follow-up visits as told by your health care provider. This is important. Where to find more information: National Alliance on Mental Illness  www.nami.org  U.S. National Institute of Mental Health  www.nimh.nih.gov  National Suicide Prevention  Lifeline  1-800-273-TALK (8255). This is free, 24-hour help.  Contact a health care provider if:  Your symptoms get worse.  You develop new symptoms. Get help right away if:  You self-harm.  You have serious thoughts about hurting yourself or others.  You see, hear, taste, smell, or feel things that are not present (hallucinate). This information is not intended to replace advice given to you by your health care provider. Make sure you discuss any questions you have with your health care provider. Document Released: 05/17/2012 Document Revised: 09/27/2015 Document Reviewed: 08/01/2015 Elsevier Interactive Patient Education  2018 [employer]  

## 2017-03-16 DIAGNOSIS — F900 Attention-deficit hyperactivity disorder, predominantly inattentive type: Secondary | ICD-10-CM | POA: Diagnosis not present

## 2017-03-16 DIAGNOSIS — F419 Anxiety disorder, unspecified: Secondary | ICD-10-CM | POA: Diagnosis not present

## 2017-03-16 DIAGNOSIS — F5109 Other insomnia not due to a substance or known physiological condition: Secondary | ICD-10-CM | POA: Diagnosis not present

## 2017-03-18 ENCOUNTER — Encounter: Payer: Self-pay | Admitting: Internal Medicine

## 2017-03-19 ENCOUNTER — Other Ambulatory Visit: Payer: Self-pay | Admitting: Internal Medicine

## 2017-03-24 ENCOUNTER — Other Ambulatory Visit: Payer: Self-pay | Admitting: Internal Medicine

## 2017-03-24 DIAGNOSIS — F900 Attention-deficit hyperactivity disorder, predominantly inattentive type: Secondary | ICD-10-CM

## 2017-03-24 DIAGNOSIS — F909 Attention-deficit hyperactivity disorder, unspecified type: Secondary | ICD-10-CM

## 2017-03-24 MED ORDER — AMPHETAMINE-DEXTROAMPHETAMINE 10 MG PO TABS: 10.0000 mg | ORAL_TABLET | Freq: Two times a day (BID) | ORAL | 0 refills | Status: DC

## 2017-04-06 ENCOUNTER — Encounter: Payer: Self-pay | Admitting: Internal Medicine

## 2017-04-15 ENCOUNTER — Encounter: Payer: Self-pay | Admitting: Internal Medicine

## 2017-04-16 ENCOUNTER — Other Ambulatory Visit: Payer: Self-pay | Admitting: Internal Medicine

## 2017-04-16 DIAGNOSIS — K5732 Diverticulitis of large intestine without perforation or abscess without bleeding: Secondary | ICD-10-CM | POA: Insufficient documentation

## 2017-04-28 ENCOUNTER — Encounter: Payer: Self-pay | Admitting: Internal Medicine

## 2017-04-29 DIAGNOSIS — R1032 Left lower quadrant pain: Secondary | ICD-10-CM | POA: Diagnosis not present

## 2017-04-29 DIAGNOSIS — R933 Abnormal findings on diagnostic imaging of other parts of digestive tract: Secondary | ICD-10-CM | POA: Diagnosis not present

## 2017-04-29 DIAGNOSIS — K5732 Diverticulitis of large intestine without perforation or abscess without bleeding: Secondary | ICD-10-CM | POA: Diagnosis not present

## 2017-07-13 ENCOUNTER — Other Ambulatory Visit: Payer: Self-pay | Admitting: Internal Medicine

## 2017-07-13 DIAGNOSIS — F900 Attention-deficit hyperactivity disorder, predominantly inattentive type: Secondary | ICD-10-CM

## 2017-07-14 MED ORDER — AMPHETAMINE-DEXTROAMPHETAMINE 10 MG PO TABS: 10.0000 mg | ORAL_TABLET | Freq: Two times a day (BID) | ORAL | 0 refills | Status: DC

## 2017-07-15 ENCOUNTER — Other Ambulatory Visit: Payer: Self-pay | Admitting: Internal Medicine

## 2017-07-15 DIAGNOSIS — E781 Pure hyperglyceridemia: Secondary | ICD-10-CM

## 2017-07-29 DIAGNOSIS — K5732 Diverticulitis of large intestine without perforation or abscess without bleeding: Secondary | ICD-10-CM | POA: Diagnosis not present

## 2017-10-08 ENCOUNTER — Other Ambulatory Visit: Payer: Self-pay | Admitting: Internal Medicine

## 2017-10-08 DIAGNOSIS — F900 Attention-deficit hyperactivity disorder, predominantly inattentive type: Secondary | ICD-10-CM

## 2017-10-09 NOTE — Telephone Encounter (Signed)
Last OV 03/03/17   [place] Controlled Substance Database checked. Last filled on 07/14/17

## 2017-10-10 MED ORDER — AMPHETAMINE-DEXTROAMPHETAMINE 10 MG PO TABS: 10.0000 mg | ORAL_TABLET | Freq: Two times a day (BID) | ORAL | 0 refills | Status: DC

## 2017-12-28 ENCOUNTER — Other Ambulatory Visit: Payer: Self-pay | Admitting: Internal Medicine

## 2017-12-28 DIAGNOSIS — F900 Attention-deficit hyperactivity disorder, predominantly inattentive type: Secondary | ICD-10-CM

## 2017-12-28 MED ORDER — AMPHETAMINE-DEXTROAMPHETAMINE 10 MG PO TABS: 10.0000 mg | ORAL_TABLET | Freq: Two times a day (BID) | ORAL | 0 refills | Status: DC

## 2017-12-28 NOTE — Telephone Encounter (Signed)
LOV was in 02/2017. Rx last refilled 10/2017  Please advise.

## 2018-01-04 DIAGNOSIS — F419 Anxiety disorder, unspecified: Secondary | ICD-10-CM | POA: Diagnosis not present

## 2018-01-04 DIAGNOSIS — R1032 Left lower quadrant pain: Secondary | ICD-10-CM | POA: Diagnosis not present

## 2018-01-04 DIAGNOSIS — K625 Hemorrhage of anus and rectum: Secondary | ICD-10-CM | POA: Diagnosis not present

## 2018-01-13 DIAGNOSIS — F4521 Hypochondriasis: Secondary | ICD-10-CM | POA: Diagnosis not present

## 2018-02-15 DIAGNOSIS — F419 Anxiety disorder, unspecified: Secondary | ICD-10-CM | POA: Diagnosis not present

## 2018-02-15 DIAGNOSIS — R1032 Left lower quadrant pain: Secondary | ICD-10-CM | POA: Diagnosis not present

## 2018-02-18 ENCOUNTER — Other Ambulatory Visit: Payer: Self-pay | Admitting: Internal Medicine

## 2018-02-18 DIAGNOSIS — F900 Attention-deficit hyperactivity disorder, predominantly inattentive type: Secondary | ICD-10-CM

## 2018-02-19 MED ORDER — AMPHETAMINE-DEXTROAMPHETAMINE 10 MG PO TABS: 10.0000 mg | ORAL_TABLET | Freq: Two times a day (BID) | ORAL | 0 refills | Status: DC

## 2018-02-19 NOTE — Telephone Encounter (Signed)
Check [place] registry last filled 10/10/2017.Marland KitchenJohny Haney

## 2018-04-12 ENCOUNTER — Other Ambulatory Visit: Payer: Self-pay | Admitting: Internal Medicine

## 2018-04-12 DIAGNOSIS — F900 Attention-deficit hyperactivity disorder, predominantly inattentive type: Secondary | ICD-10-CM

## 2018-04-19 ENCOUNTER — Other Ambulatory Visit: Payer: Self-pay | Admitting: Internal Medicine

## 2018-04-19 DIAGNOSIS — F900 Attention-deficit hyperactivity disorder, predominantly inattentive type: Secondary | ICD-10-CM

## 2018-04-19 MED ORDER — AMPHETAMINE-DEXTROAMPHETAMINE 10 MG PO TABS: 10.0000 mg | ORAL_TABLET | Freq: Two times a day (BID) | ORAL | 0 refills | Status: DC

## 2018-05-11 ENCOUNTER — Encounter: Payer: Self-pay | Admitting: Internal Medicine

## 2018-05-12 ENCOUNTER — Other Ambulatory Visit: Payer: Self-pay | Admitting: Internal Medicine

## 2018-05-12 DIAGNOSIS — E781 Pure hyperglyceridemia: Secondary | ICD-10-CM

## 2018-05-12 MED ORDER — OMEGA-3-ACID ETHYL ESTERS 1 G PO CAPS
2.0000 | ORAL_CAPSULE | Freq: Two times a day (BID) | ORAL | 1 refills | Status: DC
Start: 2018-05-12 — End: 2018-11-23

## 2018-06-07 ENCOUNTER — Other Ambulatory Visit: Payer: Self-pay | Admitting: Internal Medicine

## 2018-06-07 DIAGNOSIS — F900 Attention-deficit hyperactivity disorder, predominantly inattentive type: Secondary | ICD-10-CM

## 2018-06-07 MED ORDER — AMPHETAMINE-DEXTROAMPHETAMINE 10 MG PO TABS: 10.0000 mg | ORAL_TABLET | Freq: Two times a day (BID) | ORAL | 0 refills | Status: DC

## 2018-06-07 NOTE — Telephone Encounter (Signed)
Per database, pt last filled adderall on 04/19/2018

## 2018-07-29 ENCOUNTER — Other Ambulatory Visit: Payer: Self-pay | Admitting: Internal Medicine

## 2018-07-29 DIAGNOSIS — F900 Attention-deficit hyperactivity disorder, predominantly inattentive type: Secondary | ICD-10-CM

## 2018-07-29 MED ORDER — AMPHETAMINE-DEXTROAMPHETAMINE 10 MG PO TABS: 10.0000 mg | ORAL_TABLET | Freq: Two times a day (BID) | ORAL | 0 refills | Status: DC

## 2018-08-10 IMAGING — DX DG ABDOMEN ACUTE W/ 1V CHEST
3 series · 3 of 3 positions shown · non-contrast
Comparison: None.

CLINICAL DATA: Left lower quadrant pain for 2 days.  Constipation.

EXAM:
DG ABDOMEN ACUTE W/ 1V CHEST

[chest pa]
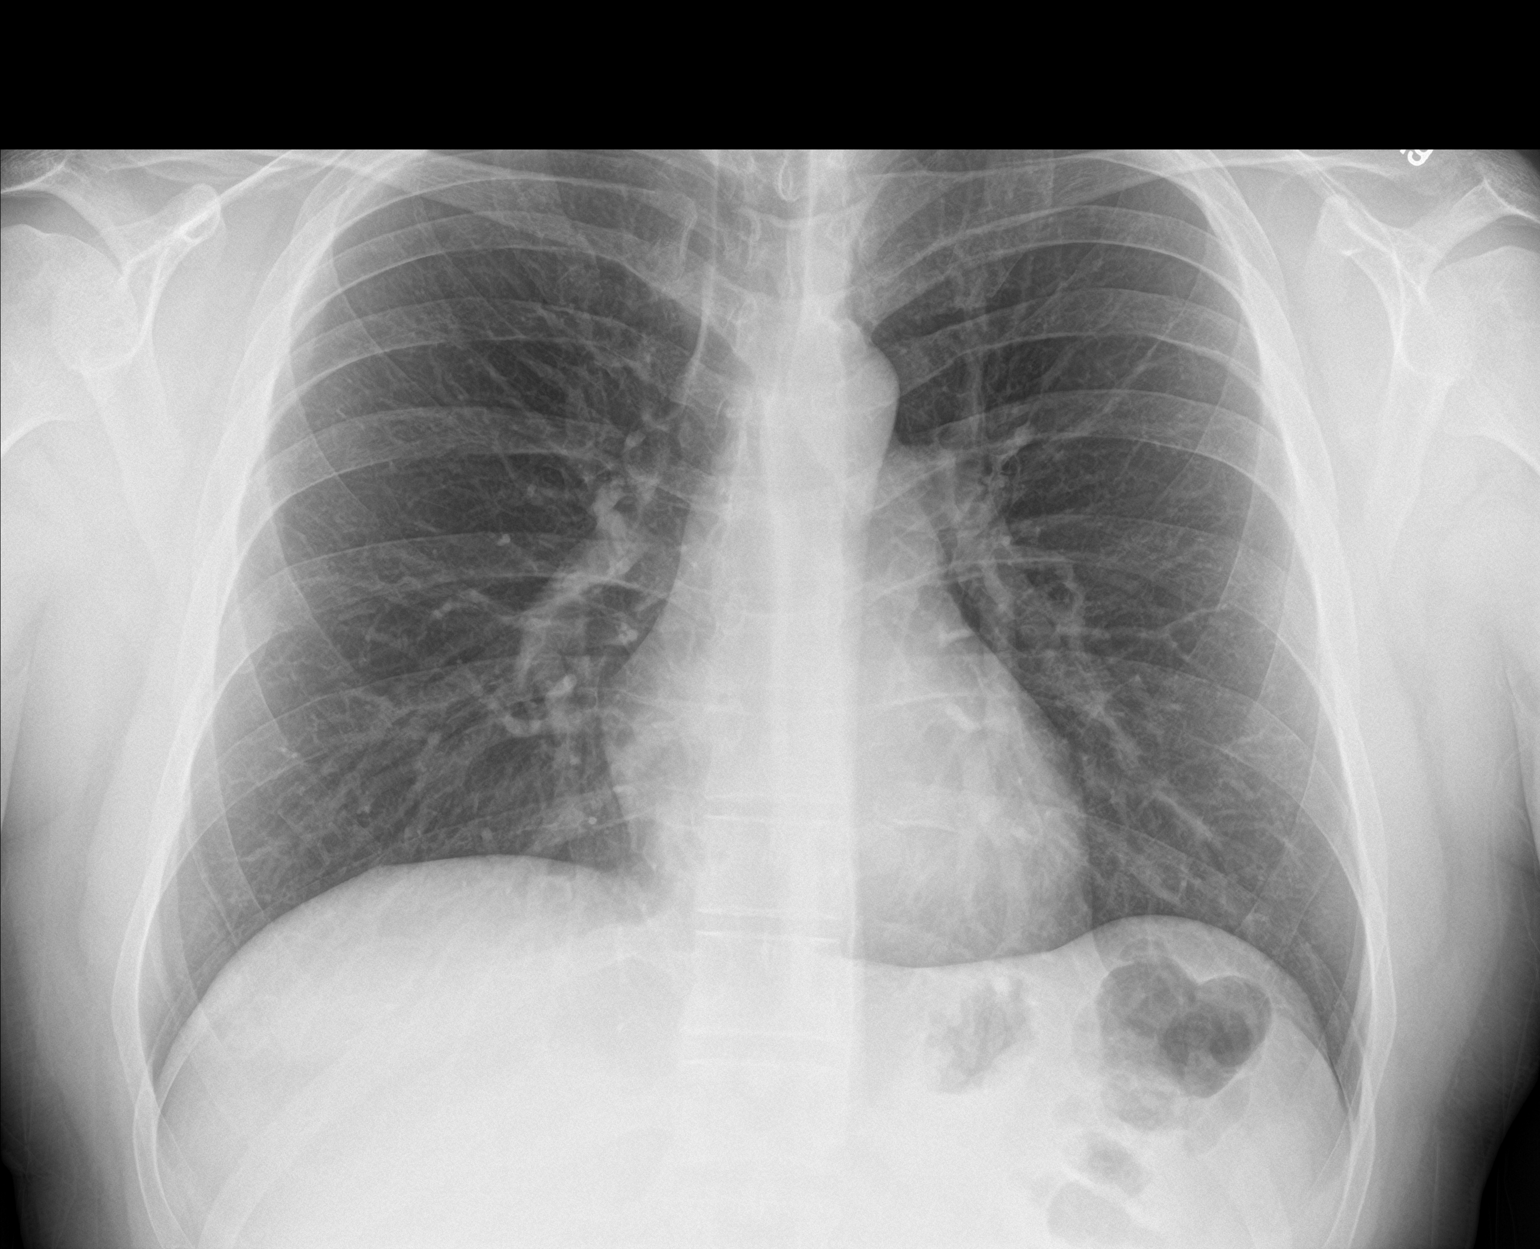

[abdomen erect]
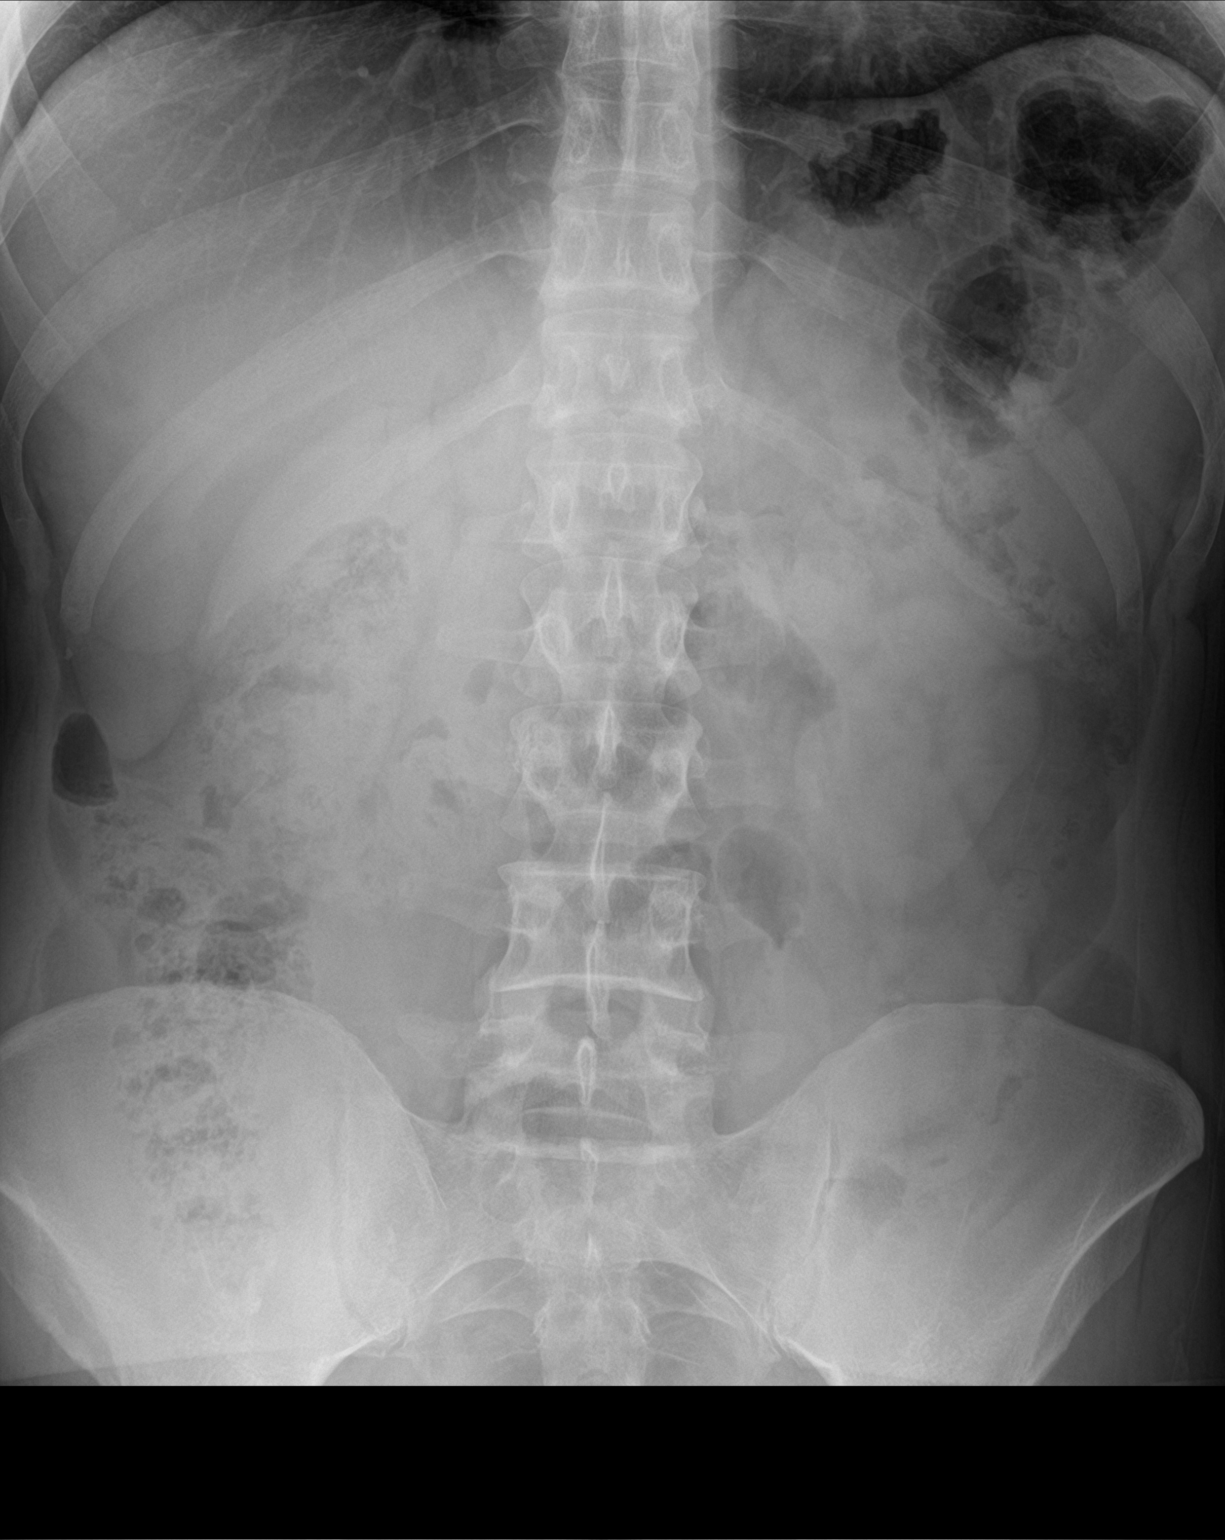

[abdomen supine]
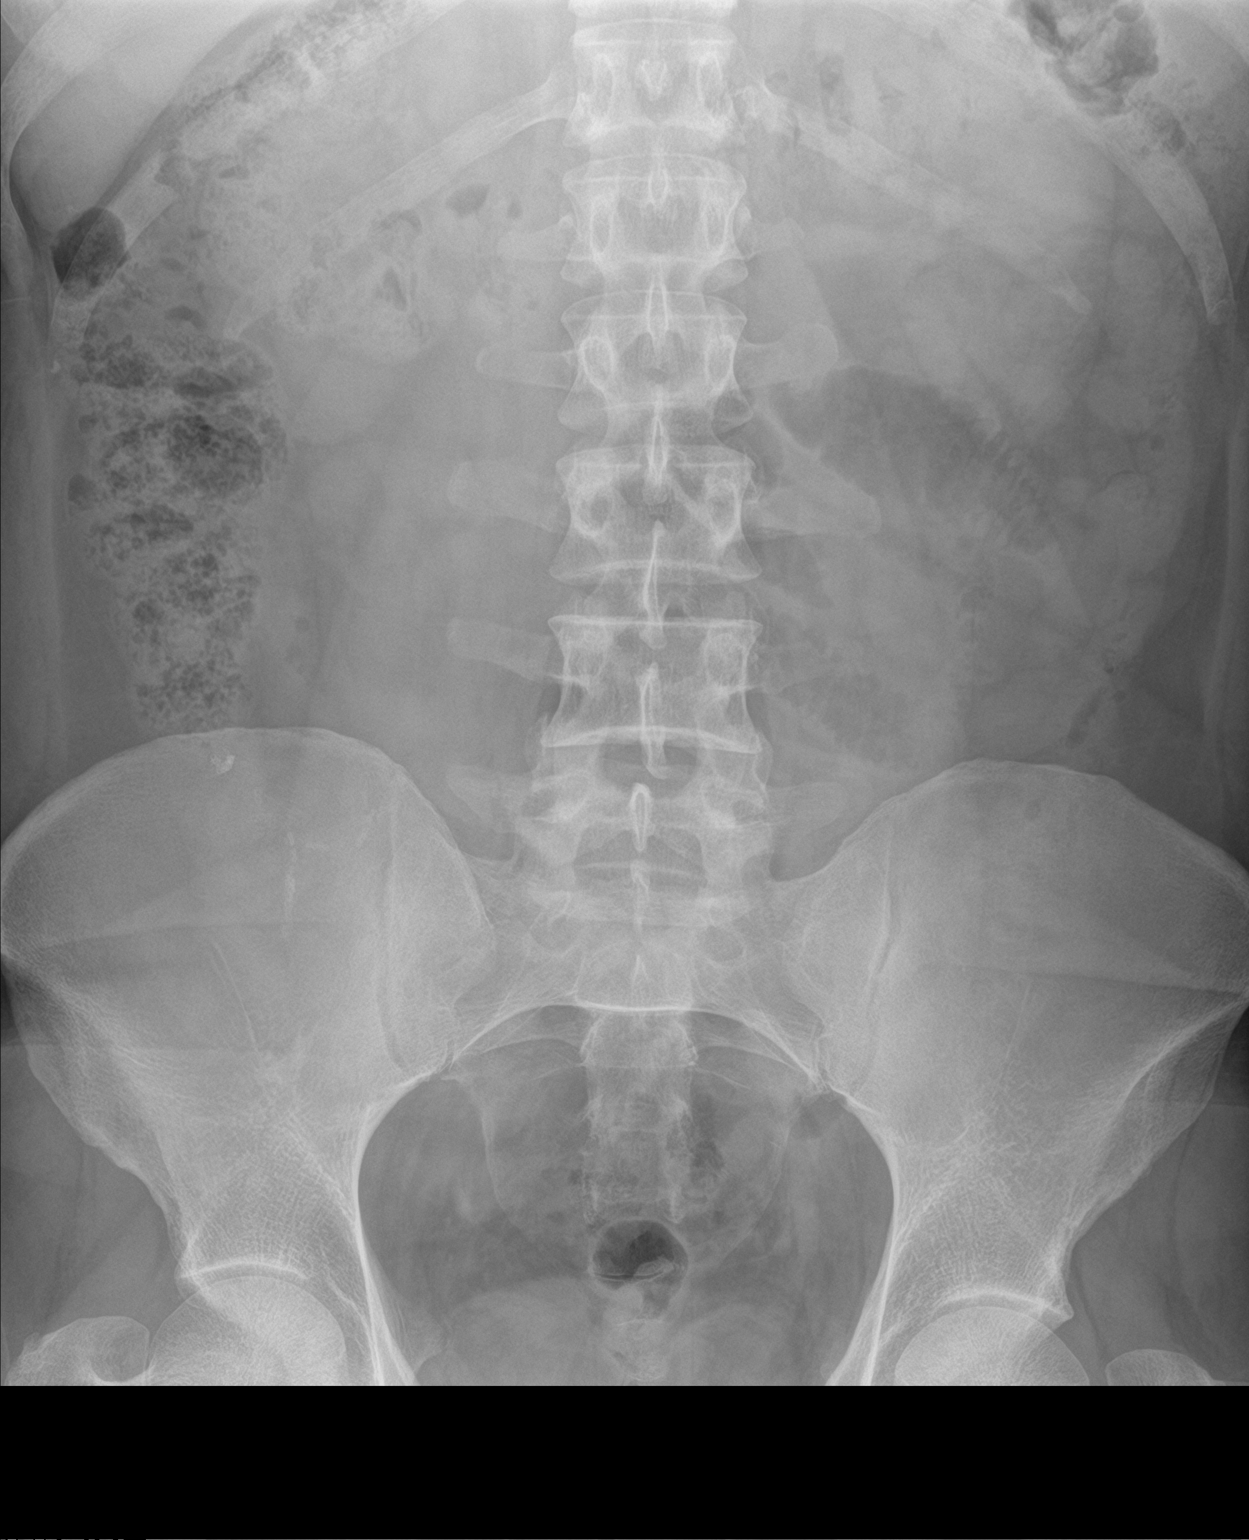

[3 of 3 positions shown; findings below may reference images not displayed]

FINDINGS: Single-view of the chest demonstrates clear lungs and normal heart
size. No pneumothorax or pleural effusion. No bony abnormality.

Two views of the abdomen show no free intraperitoneal air. The bowel
gas pattern is nonobstructive. Moderately large stool burden noted.
IMPRESSION: No acute abnormality.

Moderately large stool burden.

## 2018-09-22 ENCOUNTER — Other Ambulatory Visit: Payer: Self-pay | Admitting: Internal Medicine

## 2018-09-22 DIAGNOSIS — F900 Attention-deficit hyperactivity disorder, predominantly inattentive type: Secondary | ICD-10-CM

## 2018-09-24 ENCOUNTER — Encounter: Payer: Self-pay | Admitting: Internal Medicine

## 2018-09-30 ENCOUNTER — Other Ambulatory Visit: Payer: Self-pay | Admitting: Internal Medicine

## 2018-09-30 DIAGNOSIS — F900 Attention-deficit hyperactivity disorder, predominantly inattentive type: Secondary | ICD-10-CM

## 2018-09-30 MED ORDER — AMPHETAMINE-DEXTROAMPHETAMINE 10 MG PO TABS: 10.0000 mg | ORAL_TABLET | Freq: Two times a day (BID) | ORAL | 0 refills | Status: DC

## 2018-10-05 ENCOUNTER — Encounter: Payer: Self-pay | Admitting: Internal Medicine

## 2018-10-05 ENCOUNTER — Ambulatory Visit (INDEPENDENT_AMBULATORY_CARE_PROVIDER_SITE_OTHER): Payer: BC Managed Care – PPO | Admitting: Internal Medicine

## 2018-10-05 ENCOUNTER — Other Ambulatory Visit (INDEPENDENT_AMBULATORY_CARE_PROVIDER_SITE_OTHER): Payer: BC Managed Care – PPO

## 2018-10-05 ENCOUNTER — Ambulatory Visit: Payer: BLUE CROSS/BLUE SHIELD | Admitting: Internal Medicine

## 2018-10-05 ENCOUNTER — Other Ambulatory Visit: Payer: Self-pay

## 2018-10-05 VITALS — BP 136/92 | HR 72 | Temp 98.4°F | Resp 16 | Ht 69.0 in | Wt 185.8 lb

## 2018-10-05 DIAGNOSIS — E781 Pure hyperglyceridemia: Secondary | ICD-10-CM

## 2018-10-05 DIAGNOSIS — F900 Attention-deficit hyperactivity disorder, predominantly inattentive type: Secondary | ICD-10-CM

## 2018-10-05 DIAGNOSIS — I10 Essential (primary) hypertension: Secondary | ICD-10-CM

## 2018-10-05 DIAGNOSIS — Z0000 Encounter for general adult medical examination without abnormal findings: Secondary | ICD-10-CM | POA: Diagnosis not present

## 2018-10-05 DIAGNOSIS — Z23 Encounter for immunization: Secondary | ICD-10-CM | POA: Diagnosis not present

## 2018-10-05 DIAGNOSIS — E785 Hyperlipidemia, unspecified: Secondary | ICD-10-CM | POA: Diagnosis not present

## 2018-10-05 LAB — URINALYSIS, ROUTINE W REFLEX MICROSCOPIC
Bilirubin Urine: NEGATIVE
Hgb urine dipstick: NEGATIVE
Ketones, ur: NEGATIVE
Leukocytes,Ua: NEGATIVE
Nitrite: NEGATIVE
RBC / HPF: NONE SEEN (ref 0–?)
Specific Gravity, Urine: 1.015 (ref 1.000–1.030)
Total Protein, Urine: NEGATIVE
Urine Glucose: NEGATIVE
Urobilinogen, UA: 0.2 (ref 0.0–1.0)
WBC, UA: NONE SEEN (ref 0–?)
pH: 7 (ref 5.0–8.0)

## 2018-10-05 LAB — CBC WITH DIFFERENTIAL/PLATELET
Basophils Absolute: 0 10*3/uL (ref 0.0–0.1)
Basophils Relative: 0.8 % (ref 0.0–3.0)
Eosinophils Absolute: 0 10*3/uL (ref 0.0–0.7)
Eosinophils Relative: 0.9 % (ref 0.0–5.0)
HCT: 41.3 % (ref 39.0–52.0)
Hemoglobin: 14.1 g/dL (ref 13.0–17.0)
Lymphocytes Relative: 36.2 % (ref 12.0–46.0)
Lymphs Abs: 1.5 10*3/uL (ref 0.7–4.0)
MCHC: 34.2 g/dL (ref 30.0–36.0)
MCV: 87.3 fl (ref 78.0–100.0)
Monocytes Absolute: 0.3 10*3/uL (ref 0.1–1.0)
Monocytes Relative: 7.3 % (ref 3.0–12.0)
Neutro Abs: 2.3 10*3/uL (ref 1.4–7.7)
Neutrophils Relative %: 54.8 % (ref 43.0–77.0)
Platelets: 260 10*3/uL (ref 150.0–400.0)
RBC: 4.73 Mil/uL (ref 4.22–5.81)
RDW: 13.5 % (ref 11.5–15.5)
WBC: 4.3 10*3/uL (ref 4.0–10.5)

## 2018-10-05 LAB — BASIC METABOLIC PANEL
BUN: 17 mg/dL (ref 6–23)
CO2: 28 mEq/L (ref 19–32)
Calcium: 9.2 mg/dL (ref 8.4–10.5)
Chloride: 102 mEq/L (ref 96–112)
Creatinine, Ser: 1.2 mg/dL (ref 0.40–1.50)
GFR: 68.35 mL/min (ref >60.00)
Glucose, Bld: 84 mg/dL (ref 70–99)
Potassium: 3.9 mEq/L (ref 3.5–5.1)
Sodium: 138 mEq/L (ref 135–145)

## 2018-10-05 LAB — HEPATIC FUNCTION PANEL
ALT: 32 U/L (ref 0–53)
AST: 20 U/L (ref 0–37)
Albumin: 4.2 g/dL (ref 3.5–5.2)
Alkaline Phosphatase: 45 U/L (ref 39–117)
Bilirubin, Direct: 0.1 mg/dL (ref 0.0–0.3)
Total Bilirubin: 0.5 mg/dL (ref 0.2–1.2)
Total Protein: 7.1 g/dL (ref 6.0–8.3)

## 2018-10-05 LAB — LIPID PANEL
Cholesterol: 269 mg/dL — ABNORMAL HIGH (ref 0–200)
HDL: 52.8 mg/dL (ref >39.00)
LDL Cholesterol: 193 mg/dL — ABNORMAL HIGH (ref 0–99)
NonHDL: 216.51
Total CHOL/HDL Ratio: 5
Triglycerides: 117 mg/dL (ref 0.0–149.0)
VLDL: 23.4 mg/dL (ref 0.0–40.0)

## 2018-10-05 LAB — TSH: TSH: 1.01 u[IU]/mL (ref 0.35–4.50)

## 2018-10-05 MED ORDER — AMPHETAMINE-DEXTROAMPHETAMINE 10 MG PO TABS: 10.0000 mg | ORAL_TABLET | Freq: Two times a day (BID) | ORAL | 0 refills | Status: DC

## 2018-10-05 MED ORDER — ROSUVASTATIN CALCIUM 20 MG PO TABS: 20.0000 mg | ORAL_TABLET | Freq: Every day | ORAL | 1 refills | Status: DC

## 2018-10-05 MED ORDER — IRBESARTAN 150 MG PO TABS: 150.0000 mg | ORAL_TABLET | Freq: Every day | ORAL | 0 refills | Status: DC

## 2018-10-05 MED ORDER — EDARBI 40 MG PO TABS: 1.0000 | ORAL_TABLET | Freq: Every day | ORAL | 1 refills | Status: DC

## 2018-10-05 NOTE — Progress Notes (Signed)
Subjective:  Patient ID: Kenneth Haney, male    DOB: 1980-10-03  Age: 38 y.o. MRN: [id number]  CC: Annual Exam, Hypertension, and Hyperlipidemia   HPI [facility] presents for a CPX.  Since I last saw him he has been diagnosed by a psychiatrist with hypochondriasis.  He tells me he is getting much better since he started taking Pristiq.  He struggles with insomnia and he takes melatonin which helps some.  He is very active.  He plays tennis.  He denies any recent episodes of headache, blurred vision, CP, DOE, palpitations, edema, or fatigue.  He has intentionally lost weight.  Outpatient Medications Prior to Visit  Medication Sig Dispense Refill  . Desvenlafaxine Succinate (PRISTIQ PO) Take by mouth. Pt is not sure of the dose    . amphetamine-dextroamphetamine (ADDERALL) 10 MG tablet Take 1 tablet (10 mg total) by mouth 2 (two) times daily. 60 tablet 0  . omega-3 acid ethyl esters (LOVAZA) 1 g capsule Take 2 capsules (2 g total) by mouth 2 (two) times daily. (Patient not taking: Reported on 10/05/2018) 360 capsule 1   No facility-administered medications prior to visit.     ROS Review of Systems  Constitutional: Negative for diaphoresis, fatigue and unexpected weight change.  HENT: Negative.   Eyes: Negative.   Respiratory: Negative for cough, chest tightness and wheezing.   Cardiovascular: Negative for chest pain, palpitations and leg swelling.  Gastrointestinal: Negative for abdominal pain, constipation, diarrhea and nausea.  Endocrine: Negative.   Genitourinary: Negative.  Negative for difficulty urinating, penile swelling, scrotal swelling, testicular pain and urgency.  Musculoskeletal: Negative for arthralgias and myalgias.  Skin: Negative.   Neurological: Negative.  Negative for dizziness, seizures, weakness, light-headedness, numbness and headaches.  Hematological: Negative for adenopathy. Does not bruise/bleed easily.  Psychiatric/Behavioral: Positive for  decreased concentration, dysphoric mood and sleep disturbance. Negative for agitation, behavioral problems, confusion, hallucinations and self-injury. The patient is not nervous/anxious and is not hyperactive.     Objective:  BP (!) 136/92 (BP Location: Left Arm, Patient Position: Sitting, Cuff Size: Normal)   Pulse 72   Temp 98.4 F (36.9 C) (Oral)   Resp 16   Ht 5\' 9"  (1.753 m)   Wt 185 lb 12 oz (84.3 kg)   SpO2 98%   BMI 27.43 kg/m   BP Readings from Last 3 Encounters:  10/05/18 (!) 136/92  03/03/17 138/70  12/15/16 120/84    Wt Readings from Last 3 Encounters:  10/05/18 185 lb 12 oz (84.3 kg)  03/03/17 192 lb 1.3 oz (87.1 kg)  12/15/16 192 lb (87.1 kg)    Physical Exam Vitals signs reviewed.  Constitutional:      Appearance: Normal appearance. He is not ill-appearing or diaphoretic.  HENT:     Nose: Nose normal.     Mouth/Throat:     Mouth: Mucous membranes are moist.  Eyes:     General: No scleral icterus.    Conjunctiva/sclera: Conjunctivae normal.  Neck:     Musculoskeletal: Normal range of motion and neck supple. No neck rigidity or muscular tenderness.  Cardiovascular:     Rate and Rhythm: Normal rate and regular rhythm.     Heart sounds: No murmur.  Pulmonary:     Effort: Pulmonary effort is normal.     Breath sounds: No stridor. No wheezing, rhonchi or rales.  Abdominal:     General: Abdomen is flat. There is no distension.     Palpations: There is no hepatomegaly, splenomegaly  or mass.     Tenderness: There is no abdominal tenderness.     Hernia: No hernia is present.  Musculoskeletal: Normal range of motion.     Right lower leg: No edema.     Left lower leg: No edema.  Lymphadenopathy:     Cervical: No cervical adenopathy.  Skin:    General: Skin is warm and dry.     Coloration: Skin is not pale.  Neurological:     General: No focal deficit present.     Mental Status: He is alert and oriented to person, place, and time.  Psychiatric:         Attention and Perception: Attention and perception normal.        Mood and Affect: Mood is anxious. Mood is not depressed. Affect is not labile, blunt or angry.        Speech: Speech normal.        Behavior: Behavior normal.        Thought Content: Thought content normal.        Cognition and Memory: Cognition normal.     Lab Results  Component Value Date   WBC 4.3 10/05/2018   HGB 14.1 10/05/2018   HCT 41.3 10/05/2018   PLT 260.0 10/05/2018   GLUCOSE 84 10/05/2018   CHOL 269 (H) 10/05/2018   TRIG 117.0 10/05/2018   HDL 52.80 10/05/2018   LDLDIRECT 150.0 07/08/2016   LDLCALC 193 (H) 10/05/2018   ALT 32 10/05/2018   AST 20 10/05/2018   NA 138 10/05/2018   K 3.9 10/05/2018   CL 102 10/05/2018   CREATININE 1.20 10/05/2018   BUN 17 10/05/2018   CO2 28 10/05/2018   TSH 1.01 10/05/2018    Dg Abd Acute W/chest  Result Date: 06/26/2016 CLINICAL DATA:  Left lower quadrant pain for 2 days.  Constipation. EXAM: DG ABDOMEN ACUTE W/ 1V CHEST COMPARISON:  None. FINDINGS: Single-view of the chest demonstrates clear lungs and normal heart size. No pneumothorax or pleural effusion. No bony abnormality. Two views of the abdomen show no free intraperitoneal air. The bowel gas pattern is nonobstructive. Moderately large stool burden noted. IMPRESSION: No acute abnormality. Moderately large stool burden. Electronically Signed   By: Inge Rise M.D.   On: 06/26/2016 10:31    Assessment & Plan:   Rameek was seen today for annual exam, hypertension and hyperlipidemia.  Diagnoses and all orders for this visit:  Essential hypertension, benign- He has developed stage I hypertension.  His labs are negative for secondary causes or endorgan damage.  In addition to lifestyle modifications I asked him to start taking an ARB.  I have asked him to stop taking melatonin. -     CBC with Differential/Platelet; Future -     Basic metabolic panel; Future -     TSH; Future -     Urinalysis, Routine w  reflex microscopic; Future -     Discontinue: Azilsartan Medoxomil (EDARBI) 40 MG TABS; Take 1 tablet by mouth daily. -     irbesartan (AVAPRO) 150 MG tablet; Take 1 tablet (150 mg total) by mouth daily.  Routine general medical examination at a health care facility- Exam completed, labs reviewed, vaccines reviewed and updated, patient education was given. -     Lipid panel; Future -     HIV Antibody (routine testing w rflx); Future  Pure hyperglyceridemia- His triglycerides are normal now.  He was praised for his lifestyle modifications. -     Hepatic function panel;  Future  Hyperlipidemia with target LDL less than 130- His LDL is up to 193.  I have asked him to start taking a statin for CV risk reduction. -     Hepatic function panel; Future -     rosuvastatin (CRESTOR) 20 MG tablet; Take 1 tablet (20 mg total) by mouth daily.  Need for influenza vaccination -     Flu Vaccine QUAD 36+ mos IM  Attention deficit hyperactivity disorder (ADHD), predominantly inattentive type -     amphetamine-dextroamphetamine (ADDERALL) 10 MG tablet; Take 1 tablet (10 mg total) by mouth 2 (two) times daily.   I have discontinued Ketih R. Rosebrook's Edarbi. I am also having him start on rosuvastatin and irbesartan. Additionally, I am having him maintain his omega-3 acid ethyl esters, Desvenlafaxine Succinate (PRISTIQ PO), and amphetamine-dextroamphetamine.  Meds ordered this encounter  Medications  . amphetamine-dextroamphetamine (ADDERALL) 10 MG tablet    Sig: Take 1 tablet (10 mg total) by mouth 2 (two) times daily.    Dispense:  60 tablet    Refill:  0  . DISCONTD: Azilsartan Medoxomil (EDARBI) 40 MG TABS    Sig: Take 1 tablet by mouth daily.    Dispense:  90 tablet    Refill:  1  . rosuvastatin (CRESTOR) 20 MG tablet    Sig: Take 1 tablet (20 mg total) by mouth daily.    Dispense:  90 tablet    Refill:  1  . irbesartan (AVAPRO) 150 MG tablet    Sig: Take 1 tablet (150 mg total) by mouth  daily.    Dispense:  90 tablet    Refill:  0     Follow-up: Return in about 3 months (around 01/04/2019).  Scarlette Calico, MD

## 2018-10-05 NOTE — Patient Instructions (Signed)

## 2018-10-06 LAB — HIV ANTIBODY (ROUTINE TESTING W REFLEX): HIV 1&2 Ab, 4th Generation: NONREACTIVE

## 2018-11-08 ENCOUNTER — Other Ambulatory Visit: Payer: Self-pay | Admitting: Internal Medicine

## 2018-11-08 DIAGNOSIS — F900 Attention-deficit hyperactivity disorder, predominantly inattentive type: Secondary | ICD-10-CM

## 2018-11-10 ENCOUNTER — Other Ambulatory Visit: Payer: Self-pay | Admitting: Internal Medicine

## 2018-11-10 DIAGNOSIS — F900 Attention-deficit hyperactivity disorder, predominantly inattentive type: Secondary | ICD-10-CM

## 2018-11-10 MED ORDER — AMPHETAMINE-DEXTROAMPHETAMINE 10 MG PO TABS: 10.0000 mg | ORAL_TABLET | Freq: Two times a day (BID) | ORAL | 0 refills | Status: DC

## 2018-11-10 NOTE — Telephone Encounter (Signed)
Per database, last filled Adderall on 10/05/2018. LOV with PCP was 10/05/2018.

## 2018-11-23 ENCOUNTER — Other Ambulatory Visit: Payer: Self-pay | Admitting: Emergency Medicine

## 2018-11-23 DIAGNOSIS — E781 Pure hyperglyceridemia: Secondary | ICD-10-CM

## 2018-11-23 MED ORDER — OMEGA-3-ACID ETHYL ESTERS 1 G PO CAPS: 2.0000 | ORAL_CAPSULE | Freq: Two times a day (BID) | ORAL | 0 refills | Status: DC

## 2018-12-22 ENCOUNTER — Other Ambulatory Visit: Payer: Self-pay | Admitting: Internal Medicine

## 2018-12-22 DIAGNOSIS — F900 Attention-deficit hyperactivity disorder, predominantly inattentive type: Secondary | ICD-10-CM

## 2018-12-22 MED ORDER — AMPHETAMINE-DEXTROAMPHETAMINE 10 MG PO TABS: 10.0000 mg | ORAL_TABLET | Freq: Two times a day (BID) | ORAL | 0 refills | Status: DC

## 2019-01-03 ENCOUNTER — Other Ambulatory Visit: Payer: Self-pay | Admitting: Internal Medicine

## 2019-01-03 DIAGNOSIS — I10 Essential (primary) hypertension: Secondary | ICD-10-CM

## 2019-01-04 MED ORDER — IRBESARTAN 150 MG PO TABS: 150.0000 mg | ORAL_TABLET | Freq: Every day | ORAL | 0 refills | Status: DC

## 2019-01-24 ENCOUNTER — Other Ambulatory Visit: Payer: Self-pay | Admitting: Internal Medicine

## 2019-01-24 DIAGNOSIS — E781 Pure hyperglyceridemia: Secondary | ICD-10-CM

## 2019-01-24 MED ORDER — OMEGA-3-ACID ETHYL ESTERS 1 G PO CAPS: 2.0000 | ORAL_CAPSULE | Freq: Two times a day (BID) | ORAL | 1 refills | Status: DC

## 2019-02-09 ENCOUNTER — Other Ambulatory Visit: Payer: Self-pay | Admitting: Internal Medicine

## 2019-02-09 DIAGNOSIS — F900 Attention-deficit hyperactivity disorder, predominantly inattentive type: Secondary | ICD-10-CM

## 2019-02-15 ENCOUNTER — Other Ambulatory Visit: Payer: Self-pay | Admitting: Internal Medicine

## 2019-02-15 DIAGNOSIS — F900 Attention-deficit hyperactivity disorder, predominantly inattentive type: Secondary | ICD-10-CM

## 2019-02-21 ENCOUNTER — Encounter: Payer: Self-pay | Admitting: Internal Medicine

## 2019-02-25 ENCOUNTER — Telehealth: Payer: Self-pay

## 2019-02-25 ENCOUNTER — Other Ambulatory Visit: Payer: Self-pay | Admitting: Internal Medicine

## 2019-02-25 DIAGNOSIS — F900 Attention-deficit hyperactivity disorder, predominantly inattentive type: Secondary | ICD-10-CM

## 2019-02-25 MED ORDER — AMPHETAMINE-DEXTROAMPHETAMINE 10 MG PO TABS: 10.0000 mg | ORAL_TABLET | Freq: Two times a day (BID) | ORAL | 0 refills | Status: DC

## 2019-02-25 NOTE — Telephone Encounter (Signed)
Pt contacted and pt has scheduled an appt for Wednesday.   Can Adderall be sent in? Please advise.

## 2019-02-25 NOTE — Telephone Encounter (Signed)
New message     1. Which medications need to be refilled? (please list name of each medication and dose if known) amphetamine-dextroamphetamine (ADDERALL) 10 MG tablet(Expired)  2. Which pharmacy/location (including street and city if local pharmacy) is medication to be sent to? [facility] [place]   3. Do they need a 30 day or 90 day supply? 30 days

## 2019-03-02 ENCOUNTER — Other Ambulatory Visit: Payer: Self-pay

## 2019-03-02 ENCOUNTER — Ambulatory Visit (INDEPENDENT_AMBULATORY_CARE_PROVIDER_SITE_OTHER): Payer: BC Managed Care – PPO | Admitting: Internal Medicine

## 2019-03-02 ENCOUNTER — Encounter: Payer: Self-pay | Admitting: Internal Medicine

## 2019-03-02 VITALS — BP 132/82 | HR 92 | Temp 97.8°F | Resp 16 | Ht 69.0 in | Wt 187.4 lb

## 2019-03-02 DIAGNOSIS — F900 Attention-deficit hyperactivity disorder, predominantly inattentive type: Secondary | ICD-10-CM

## 2019-03-02 DIAGNOSIS — E785 Hyperlipidemia, unspecified: Secondary | ICD-10-CM

## 2019-03-02 DIAGNOSIS — I10 Essential (primary) hypertension: Secondary | ICD-10-CM | POA: Diagnosis not present

## 2019-03-02 DIAGNOSIS — E781 Pure hyperglyceridemia: Secondary | ICD-10-CM

## 2019-03-02 LAB — HEPATIC FUNCTION PANEL
ALT: 52 U/L (ref 0–53)
AST: 29 U/L (ref 0–37)
Albumin: 4.6 g/dL (ref 3.5–5.2)
Alkaline Phosphatase: 41 U/L (ref 39–117)
Bilirubin, Direct: 0.1 mg/dL (ref 0.0–0.3)
Total Bilirubin: 0.7 mg/dL (ref 0.2–1.2)
Total Protein: 7.2 g/dL (ref 6.0–8.3)

## 2019-03-02 LAB — BASIC METABOLIC PANEL
BUN: 19 mg/dL (ref 6–23)
CO2: 30 mEq/L (ref 19–32)
Calcium: 9.6 mg/dL (ref 8.4–10.5)
Chloride: 102 mEq/L (ref 96–112)
Creatinine, Ser: 1.14 mg/dL (ref 0.40–1.50)
GFR: 72.35 mL/min (ref >60.00)
Glucose, Bld: 72 mg/dL (ref 70–99)
Potassium: 3.7 mEq/L (ref 3.5–5.1)
Sodium: 138 mEq/L (ref 135–145)

## 2019-03-02 LAB — LIPID PANEL
Cholesterol: 159 mg/dL (ref 0–200)
HDL: 59 mg/dL (ref >39.00)
LDL Cholesterol: 77 mg/dL (ref 0–99)
NonHDL: 99.64
Total CHOL/HDL Ratio: 3
Triglycerides: 113 mg/dL (ref 0.0–149.0)
VLDL: 22.6 mg/dL (ref 0.0–40.0)

## 2019-03-02 MED ORDER — OMEGA-3-ACID ETHYL ESTERS 1 G PO CAPS: 2.0000 | ORAL_CAPSULE | Freq: Two times a day (BID) | ORAL | 1 refills | Status: DC

## 2019-03-02 NOTE — Patient Instructions (Signed)

## 2019-03-02 NOTE — Progress Notes (Signed)
Subjective:  Patient ID: Kenneth Haney, male    DOB: 1980/12/17  Age: 39 y.o. MRN: [id number]  CC: Hypertension and Hyperlipidemia   This visit occurred during the SARS-CoV-2 public health emergency.  Safety protocols were in place, including screening questions prior to the visit, additional usage of staff PPE, and extensive cleaning of exam room while observing appropriate contact time as indicated for disinfecting solutions.    HPI Kenneth Haney presents for f/up - He has been working on his lifestyle modifications to lower his triglycerides and cholesterol.  He tells me he is tolerating all the current medications well with no muscle aches, joint aches, abdominal pain, nausea, vomiting, or diarrhea.  Pt states ADD status overall stable on current meds with overall good compliance and tolerability, and good effectiveness with respect to ability for concentration and task completion.  Outpatient Medications Prior to Visit  Medication Sig Dispense Refill  . amphetamine-dextroamphetamine (ADDERALL) 10 MG tablet Take 1 tablet (10 mg total) by mouth 2 (two) times daily. 60 tablet 0  . Desvenlafaxine Succinate (PRISTIQ PO) Take by mouth. Pt is not sure of the dose    . irbesartan (AVAPRO) 150 MG tablet Take 1 tablet (150 mg total) by mouth daily. 90 tablet 0  . rosuvastatin (CRESTOR) 20 MG tablet Take 1 tablet (20 mg total) by mouth daily. 90 tablet 1  . omega-3 acid ethyl esters (LOVAZA) 1 g capsule Take 2 capsules (2 g total) by mouth 2 (two) times daily. 360 capsule 1   No facility-administered medications prior to visit.    ROS Review of Systems  Constitutional: Negative for appetite change, diaphoresis, fatigue and unexpected weight change.  HENT: Negative.   Eyes: Negative for visual disturbance.  Respiratory: Negative for cough, chest tightness, shortness of breath and wheezing.   Cardiovascular: Negative for chest pain, palpitations and leg swelling.  Gastrointestinal:  Negative for abdominal pain, constipation, diarrhea and nausea.  Endocrine: Negative.   Genitourinary: Negative.  Negative for difficulty urinating.  Musculoskeletal: Negative for arthralgias and myalgias.  Skin: Negative.  Negative for color change and pallor.  Neurological: Negative.   Hematological: Negative for adenopathy. Does not bruise/bleed easily.  Psychiatric/Behavioral: Positive for decreased concentration. Negative for dysphoric mood, sleep disturbance and suicidal ideas. The patient is not nervous/anxious.     Objective:  BP 132/82 (BP Location: Left Arm, Patient Position: Sitting, Cuff Size: Normal)   Pulse 92   Temp 97.8 F (36.6 C) (Oral)   Resp 16   Ht 5\' 9"  (1.753 m)   Wt 187 lb 6 oz (85 kg)   SpO2 99%   BMI 27.67 kg/m   BP Readings from Last 3 Encounters:  03/02/19 132/82  10/05/18 (!) 136/92  03/03/17 138/70    Wt Readings from Last 3 Encounters:  03/02/19 187 lb 6 oz (85 kg)  10/05/18 185 lb 12 oz (84.3 kg)  03/03/17 192 lb 1.3 oz (87.1 kg)    Physical Exam Vitals reviewed.  HENT:     Mouth/Throat:     Mouth: Mucous membranes are moist.  Eyes:     General: No scleral icterus.    Conjunctiva/sclera: Conjunctivae normal.  Cardiovascular:     Rate and Rhythm: Normal rate and regular rhythm.  Pulmonary:     Effort: Pulmonary effort is normal.     Breath sounds: No wheezing, rhonchi or rales.  Abdominal:     General: Abdomen is flat. There is no distension.     Palpations: There is  no hepatomegaly, splenomegaly or mass.     Tenderness: There is no abdominal tenderness. There is no guarding.  Musculoskeletal:        General: Normal range of motion.     Cervical back: Neck supple.     Right lower leg: No edema.     Left lower leg: No edema.  Lymphadenopathy:     Cervical: No cervical adenopathy.  Skin:    General: Skin is warm and dry.  Neurological:     General: No focal deficit present.     Mental Status: He is alert.  Psychiatric:         Mood and Affect: Mood normal.        Behavior: Behavior normal.     Lab Results  Component Value Date   WBC 4.3 10/05/2018   HGB 14.1 10/05/2018   HCT 41.3 10/05/2018   PLT 260.0 10/05/2018   GLUCOSE 72 03/02/2019   CHOL 159 03/02/2019   TRIG 113.0 03/02/2019   HDL 59.00 03/02/2019   LDLDIRECT 150.0 07/08/2016   LDLCALC 77 03/02/2019   ALT 52 03/02/2019   AST 29 03/02/2019   NA 138 03/02/2019   K 3.7 03/02/2019   CL 102 03/02/2019   CREATININE 1.14 03/02/2019   BUN 19 03/02/2019   CO2 30 03/02/2019   TSH 1.01 10/05/2018    DG Abd Acute W/Chest  Result Date: 06/26/2016 CLINICAL DATA:  Left lower quadrant pain for 2 days.  Constipation. EXAM: DG ABDOMEN ACUTE W/ 1V CHEST COMPARISON:  None. FINDINGS: Single-view of the chest demonstrates clear lungs and normal heart size. No pneumothorax or pleural effusion. No bony abnormality. Two views of the abdomen show no free intraperitoneal air. The bowel gas pattern is nonobstructive. Moderately large stool burden noted. IMPRESSION: No acute abnormality. Moderately large stool burden. Electronically Signed   By: Inge Rise M.D.   On: 06/26/2016 10:31    Assessment & Plan:   Sergio was seen today for hypertension and hyperlipidemia.  Diagnoses and all orders for this visit:  Essential hypertension, benign- His blood pressure is adequately well controlled.  Electrolytes and renal function are normal. -     Basic metabolic panel  Hyperlipidemia with target LDL less than 130- He has achieved his LDL goal is doing well on the statin. -     Lipid panel -     Hepatic function panel  Attention deficit hyperactivity disorder (ADHD), predominantly inattentive type- He is doing well on the current dose of Adderall.  Will continue.   I am having Kenneth Haney maintain his Desvenlafaxine Succinate (PRISTIQ PO), rosuvastatin, irbesartan, and amphetamine-dextroamphetamine.  No orders of the defined types were placed in this  encounter.    Follow-up: Return in about 6 months (around 08/30/2019).  Scarlette Calico, MD

## 2019-03-08 ENCOUNTER — Telehealth: Payer: Self-pay

## 2019-03-08 NOTE — Telephone Encounter (Signed)
KEY: [id number]

## 2019-03-10 NOTE — Telephone Encounter (Signed)
PA for Lovaza was denied 

## 2019-03-25 ENCOUNTER — Other Ambulatory Visit: Payer: BC Managed Care – PPO

## 2019-03-26 ENCOUNTER — Ambulatory Visit: Payer: BC Managed Care – PPO | Attending: Internal Medicine

## 2019-03-26 DIAGNOSIS — Z23 Encounter for immunization: Secondary | ICD-10-CM | POA: Insufficient documentation

## 2019-03-26 NOTE — Progress Notes (Signed)
   Covid-19 Vaccination Clinic  Name:  Kenneth Haney    MRN: [id number] DOB: 08/05/1980  03/26/2019  [name] was observed post Covid-19 immunization for 15 minutes without incidence. He was provided with Vaccine Information Sheet and instruction to access the V-Safe system.   [name] was instructed to call 911 with any severe reactions post vaccine: Marland Kitchen Difficulty breathing  . Swelling of your face and throat  . A fast heartbeat  . A bad rash all over your body  . Dizziness and weakness    Immunizations Administered    Name Date Dose VIS Date Route   Pfizer COVID-19 Vaccine 03/26/2019 12:47 PM 0.3 mL 01/14/2019 Intramuscular   Manufacturer: [place]   Lot: [id number]   [place]: [id number]

## 2019-03-28 ENCOUNTER — Ambulatory Visit: Payer: BC Managed Care – PPO | Attending: Internal Medicine

## 2019-03-28 DIAGNOSIS — Z20822 Contact with and (suspected) exposure to covid-19: Secondary | ICD-10-CM

## 2019-03-29 LAB — NOVEL CORONAVIRUS, NAA: SARS-CoV-2, NAA: NOT DETECTED

## 2019-03-30 ENCOUNTER — Other Ambulatory Visit: Payer: Self-pay | Admitting: Internal Medicine

## 2019-03-30 ENCOUNTER — Encounter: Payer: Self-pay | Admitting: Internal Medicine

## 2019-03-30 DIAGNOSIS — F900 Attention-deficit hyperactivity disorder, predominantly inattentive type: Secondary | ICD-10-CM

## 2019-03-30 DIAGNOSIS — E785 Hyperlipidemia, unspecified: Secondary | ICD-10-CM

## 2019-03-30 DIAGNOSIS — I10 Essential (primary) hypertension: Secondary | ICD-10-CM

## 2019-03-30 MED ORDER — ROSUVASTATIN CALCIUM 20 MG PO TABS: 20.0000 mg | ORAL_TABLET | Freq: Every day | ORAL | 1 refills | Status: DC

## 2019-03-30 NOTE — Telephone Encounter (Signed)
Jones pt

## 2019-03-31 MED ORDER — IRBESARTAN 150 MG PO TABS: 150.0000 mg | ORAL_TABLET | Freq: Every day | ORAL | 1 refills | Status: DC

## 2019-03-31 MED ORDER — AMPHETAMINE-DEXTROAMPHETAMINE 10 MG PO TABS: 10.0000 mg | ORAL_TABLET | Freq: Two times a day (BID) | ORAL | 0 refills | Status: DC

## 2019-03-31 NOTE — Telephone Encounter (Signed)
Per data base last filled on 02/25/2019 LOV with PCP was 03/02/2019  Please advise in PCP absence.

## 2019-03-31 NOTE — Telephone Encounter (Signed)
Done erx 

## 2019-04-01 ENCOUNTER — Other Ambulatory Visit: Payer: Self-pay | Admitting: Internal Medicine

## 2019-04-01 DIAGNOSIS — I10 Essential (primary) hypertension: Secondary | ICD-10-CM

## 2019-04-01 DIAGNOSIS — E785 Hyperlipidemia, unspecified: Secondary | ICD-10-CM

## 2019-04-18 ENCOUNTER — Ambulatory Visit: Payer: BC Managed Care – PPO | Attending: Internal Medicine

## 2019-04-18 DIAGNOSIS — Z23 Encounter for immunization: Secondary | ICD-10-CM

## 2019-04-18 NOTE — Progress Notes (Signed)
   Covid-19 Vaccination Clinic  Name:  Kenneth Haney    MRN: [id number] DOB: 07-Jul-1980  04/18/2019  Mr. Kenneth Haney was observed post Covid-19 immunization for 15 minutes without incident. He was provided with Vaccine Information Sheet and instruction to access the V-Safe system.   Mr. Kenneth Haney was instructed to call 911 with any severe reactions post vaccine: Marland Kitchen Difficulty breathing  . Swelling of face and throat  . A fast heartbeat  . A bad rash all over body  . Dizziness and weakness   Immunizations Administered    Name Date Dose VIS Date Route   Pfizer COVID-19 Vaccine 04/18/2019  9:21 AM 0.3 mL 01/14/2019 Intramuscular   Manufacturer: [place]   Lot: [id number]   [place]: [id number]

## 2019-05-17 ENCOUNTER — Other Ambulatory Visit: Payer: Self-pay | Admitting: Internal Medicine

## 2019-05-17 DIAGNOSIS — F900 Attention-deficit hyperactivity disorder, predominantly inattentive type: Secondary | ICD-10-CM

## 2019-05-17 MED ORDER — AMPHETAMINE-DEXTROAMPHETAMINE 10 MG PO TABS: 10.0000 mg | ORAL_TABLET | Freq: Two times a day (BID) | ORAL | 0 refills | Status: DC

## 2019-07-05 ENCOUNTER — Encounter: Payer: Self-pay | Admitting: Internal Medicine

## 2019-07-07 ENCOUNTER — Other Ambulatory Visit: Payer: Self-pay | Admitting: Internal Medicine

## 2019-07-07 DIAGNOSIS — F900 Attention-deficit hyperactivity disorder, predominantly inattentive type: Secondary | ICD-10-CM

## 2019-07-07 MED ORDER — AMPHETAMINE-DEXTROAMPHETAMINE 20 MG PO TABS: 20.0000 mg | ORAL_TABLET | Freq: Every day | ORAL | 0 refills | Status: DC

## 2019-07-07 MED ORDER — AMPHETAMINE-DEXTROAMPHETAMINE 10 MG PO TABS: 10.0000 mg | ORAL_TABLET | Freq: Every day | ORAL | 0 refills | Status: DC

## 2019-07-07 NOTE — Telephone Encounter (Signed)
[place] Controlled Database Checked Last filled:  05/18/19 # 60 LOV w/you: 03/02/19 Next appt w/you:  None

## 2019-08-16 ENCOUNTER — Other Ambulatory Visit: Payer: Self-pay | Admitting: Internal Medicine

## 2019-08-16 DIAGNOSIS — F900 Attention-deficit hyperactivity disorder, predominantly inattentive type: Secondary | ICD-10-CM

## 2019-08-16 MED ORDER — AMPHETAMINE-DEXTROAMPHETAMINE 20 MG PO TABS: 20.0000 mg | ORAL_TABLET | Freq: Every day | ORAL | 0 refills | Status: DC

## 2019-08-16 MED ORDER — AMPHETAMINE-DEXTROAMPHETAMINE 10 MG PO TABS: 10.0000 mg | ORAL_TABLET | Freq: Every day | ORAL | 0 refills | Status: DC

## 2019-08-16 NOTE — Telephone Encounter (Signed)
Check [place] registry last filled both 07/07/2019.Marland KitchenJohny Chess

## 2019-08-31 ENCOUNTER — Other Ambulatory Visit: Payer: Self-pay | Admitting: Internal Medicine

## 2019-08-31 DIAGNOSIS — E781 Pure hyperglyceridemia: Secondary | ICD-10-CM

## 2019-09-30 ENCOUNTER — Other Ambulatory Visit: Payer: Self-pay | Admitting: Internal Medicine

## 2019-09-30 DIAGNOSIS — E785 Hyperlipidemia, unspecified: Secondary | ICD-10-CM

## 2019-09-30 DIAGNOSIS — I10 Essential (primary) hypertension: Secondary | ICD-10-CM

## 2019-10-12 ENCOUNTER — Other Ambulatory Visit: Payer: Self-pay | Admitting: Internal Medicine

## 2019-10-12 DIAGNOSIS — F900 Attention-deficit hyperactivity disorder, predominantly inattentive type: Secondary | ICD-10-CM

## 2019-10-13 ENCOUNTER — Encounter: Payer: Self-pay | Admitting: Internal Medicine

## 2019-10-13 ENCOUNTER — Other Ambulatory Visit: Payer: Self-pay | Admitting: Internal Medicine

## 2019-10-13 DIAGNOSIS — F900 Attention-deficit hyperactivity disorder, predominantly inattentive type: Secondary | ICD-10-CM

## 2019-10-13 MED ORDER — AMPHETAMINE-DEXTROAMPHETAMINE 10 MG PO TABS: 10.0000 mg | ORAL_TABLET | Freq: Every day | ORAL | 0 refills | Status: DC

## 2019-10-13 MED ORDER — AMPHETAMINE-DEXTROAMPHETAMINE 20 MG PO TABS: 20.0000 mg | ORAL_TABLET | Freq: Every day | ORAL | 0 refills | Status: DC

## 2019-11-02 ENCOUNTER — Other Ambulatory Visit: Payer: Self-pay

## 2019-11-02 ENCOUNTER — Ambulatory Visit (INDEPENDENT_AMBULATORY_CARE_PROVIDER_SITE_OTHER): Payer: BC Managed Care – PPO | Admitting: Internal Medicine

## 2019-11-02 ENCOUNTER — Encounter: Payer: Self-pay | Admitting: Internal Medicine

## 2019-11-02 VITALS — BP 126/86 | HR 83 | Temp 98.0°F | Resp 16 | Ht 69.0 in | Wt 197.0 lb

## 2019-11-02 DIAGNOSIS — F900 Attention-deficit hyperactivity disorder, predominantly inattentive type: Secondary | ICD-10-CM | POA: Diagnosis not present

## 2019-11-02 DIAGNOSIS — I10 Essential (primary) hypertension: Secondary | ICD-10-CM

## 2019-11-02 DIAGNOSIS — Z23 Encounter for immunization: Secondary | ICD-10-CM | POA: Diagnosis not present

## 2019-11-02 NOTE — Progress Notes (Signed)
Subjective:  Patient ID: Kenneth Haney, male    DOB: 1980-09-03  Age: 39 y.o. MRN: [id number]  CC: Hypertension and ADHD  This visit occurred during the SARS-CoV-2 public health emergency.  Safety protocols were in place, including screening questions prior to the visit, additional usage of staff PPE, and extensive cleaning of exam room while observing appropriate contact time as indicated for disinfecting solutions.    HPI Kenneth Haney presents for f/up -  1.  Pt states ADD status overall stable on current meds with overall good compliance and tolerability, and good effectiveness with respect to ability for concentration and task completion. 2. His BP has been well controlled. He walks the golf course and has good energy with no DOE, CP, edema.  Outpatient Medications Prior to Visit  Medication Sig Dispense Refill  . amphetamine-dextroamphetamine (ADDERALL) 10 MG tablet Take 1 tablet (10 mg total) by mouth daily with breakfast. 30 tablet 0  . amphetamine-dextroamphetamine (ADDERALL) 20 MG tablet Take 1 tablet (20 mg total) by mouth daily. 30 tablet 0  . Desvenlafaxine Succinate (PRISTIQ PO) Take by mouth. Pt is not sure of the dose    . irbesartan (AVAPRO) 150 MG tablet TAKE ONE TABLET BY MOUTH DAILY 90 tablet 1  . omega-3 acid ethyl esters (LOVAZA) 1 g capsule TAKE TWO CAPSULES BY MOUTH TWICE A DAY 360 capsule 1  . rosuvastatin (CRESTOR) 20 MG tablet TAKE ONE TABLET BY MOUTH DAILY 90 tablet 1   No facility-administered medications prior to visit.    ROS Review of Systems  Constitutional: Positive for unexpected weight change (wt gain). Negative for appetite change, chills, diaphoresis and fatigue.  HENT: Negative.   Eyes: Negative.   Respiratory: Negative for cough, chest tightness, shortness of breath and wheezing.   Cardiovascular: Negative for chest pain, palpitations and leg swelling.  Gastrointestinal: Negative for abdominal pain.  Endocrine: Negative.     Genitourinary: Negative.  Negative for difficulty urinating.  Musculoskeletal: Negative.   Skin: Negative.  Negative for color change.  Neurological: Negative.  Negative for dizziness, weakness, light-headedness and headaches.  Hematological: Negative for adenopathy. Does not bruise/bleed easily.  Psychiatric/Behavioral: Positive for decreased concentration. Negative for agitation, behavioral problems, hallucinations, self-injury, sleep disturbance and suicidal ideas. The patient is not nervous/anxious.     Objective:  BP 126/86   Pulse 83   Temp 98 F (36.7 C) (Oral)   Resp 16   Ht 5\' 9"  (1.753 m)   Wt 197 lb (89.4 kg)   SpO2 99%   BMI 29.09 kg/m   BP Readings from Last 3 Encounters:  11/02/19 126/86  03/02/19 132/82  10/05/18 (!) 136/92    Wt Readings from Last 3 Encounters:  11/02/19 197 lb (89.4 kg)  03/02/19 187 lb 6 oz (85 kg)  10/05/18 185 lb 12 oz (84.3 kg)    Physical Exam Vitals reviewed.  Constitutional:      Appearance: Normal appearance.  HENT:     Nose: Nose normal.     Mouth/Throat:     Mouth: Mucous membranes are moist.  Eyes:     General: No scleral icterus.    Conjunctiva/sclera: Conjunctivae normal.  Cardiovascular:     Rate and Rhythm: Normal rate and regular rhythm.     Heart sounds: No murmur heard.   Pulmonary:     Effort: Pulmonary effort is normal.     Breath sounds: No stridor. No wheezing, rhonchi or rales.  Abdominal:     General: Abdomen is  flat.     Palpations: There is no mass.     Tenderness: There is no abdominal tenderness. There is no guarding.  Musculoskeletal:        General: Normal range of motion.     Cervical back: Neck supple.     Right lower leg: No edema.     Left lower leg: No edema.  Lymphadenopathy:     Cervical: No cervical adenopathy.  Skin:    General: Skin is warm and dry.     Coloration: Skin is not pale.  Neurological:     General: No focal deficit present.     Mental Status: He is alert.   Psychiatric:        Mood and Affect: Mood normal.        Behavior: Behavior normal.     Lab Results  Component Value Date   WBC 4.3 10/05/2018   HGB 14.1 10/05/2018   HCT 41.3 10/05/2018   PLT 260.0 10/05/2018   GLUCOSE 72 03/02/2019   CHOL 159 03/02/2019   TRIG 113.0 03/02/2019   HDL 59.00 03/02/2019   LDLDIRECT 150.0 07/08/2016   LDLCALC 77 03/02/2019   ALT 52 03/02/2019   AST 29 03/02/2019   NA 138 03/02/2019   K 3.7 03/02/2019   CL 102 03/02/2019   CREATININE 1.14 03/02/2019   BUN 19 03/02/2019   CO2 30 03/02/2019   TSH 1.01 10/05/2018    DG Abd Acute W/Chest  Result Date: 06/26/2016 CLINICAL DATA:  Left lower quadrant pain for 2 days.  Constipation. EXAM: DG ABDOMEN ACUTE W/ 1V CHEST COMPARISON:  None. FINDINGS: Single-view of the chest demonstrates clear lungs and normal heart size. No pneumothorax or pleural effusion. No bony abnormality. Two views of the abdomen show no free intraperitoneal air. The bowel gas pattern is nonobstructive. Moderately large stool burden noted. IMPRESSION: No acute abnormality. Moderately large stool burden. Electronically Signed   By: Inge Rise M.D.   On: 06/26/2016 10:31    Assessment & Plan:   Kenneth Haney was seen today for hypertension and adhd.  Diagnoses and all orders for this visit:  Essential hypertension, benign- His BP is well controlled. Will continue the current ARB dose.  Attention deficit hyperactivity disorder (ADHD), predominantly inattentive type- He is doing well on the current dose of amphetamine. Will continue.  Other orders -     Flu Vaccine QUAD 6+ mos PF IM (Fluarix Quad PF)   I am having Kenneth Haney maintain his Desvenlafaxine Succinate (PRISTIQ PO), omega-3 acid ethyl esters, irbesartan, rosuvastatin, amphetamine-dextroamphetamine, and amphetamine-dextroamphetamine.  No orders of the defined types were placed in this encounter.    Follow-up: No follow-ups on file.  Scarlette Calico, MD

## 2019-11-21 ENCOUNTER — Other Ambulatory Visit: Payer: Self-pay | Admitting: Internal Medicine

## 2019-11-21 DIAGNOSIS — F900 Attention-deficit hyperactivity disorder, predominantly inattentive type: Secondary | ICD-10-CM

## 2019-11-21 MED ORDER — AMPHETAMINE-DEXTROAMPHETAMINE 20 MG PO TABS: 20.0000 mg | ORAL_TABLET | Freq: Every day | ORAL | 0 refills | Status: DC

## 2019-11-21 MED ORDER — AMPHETAMINE-DEXTROAMPHETAMINE 10 MG PO TABS: 10.0000 mg | ORAL_TABLET | Freq: Every day | ORAL | 0 refills | Status: DC

## 2020-01-11 ENCOUNTER — Other Ambulatory Visit: Payer: Self-pay | Admitting: Internal Medicine

## 2020-01-11 DIAGNOSIS — F900 Attention-deficit hyperactivity disorder, predominantly inattentive type: Secondary | ICD-10-CM

## 2020-01-11 MED ORDER — AMPHETAMINE-DEXTROAMPHETAMINE 20 MG PO TABS: 20.0000 mg | ORAL_TABLET | Freq: Every day | ORAL | 0 refills | Status: DC

## 2020-01-11 MED ORDER — AMPHETAMINE-DEXTROAMPHETAMINE 10 MG PO TABS: 10.0000 mg | ORAL_TABLET | Freq: Every day | ORAL | 0 refills | Status: DC

## 2020-01-25 ENCOUNTER — Encounter: Payer: Self-pay | Admitting: Internal Medicine

## 2020-01-26 ENCOUNTER — Other Ambulatory Visit: Payer: Self-pay | Admitting: Internal Medicine

## 2020-01-26 DIAGNOSIS — E781 Pure hyperglyceridemia: Secondary | ICD-10-CM

## 2020-01-26 MED ORDER — OMEGA-3-ACID ETHYL ESTERS 1 G PO CAPS: 2.0000 | ORAL_CAPSULE | Freq: Two times a day (BID) | ORAL | 1 refills | Status: DC

## 2020-02-23 ENCOUNTER — Other Ambulatory Visit: Payer: Self-pay | Admitting: Internal Medicine

## 2020-02-23 DIAGNOSIS — F900 Attention-deficit hyperactivity disorder, predominantly inattentive type: Secondary | ICD-10-CM

## 2020-02-23 MED ORDER — AMPHETAMINE-DEXTROAMPHETAMINE 20 MG PO TABS: 20.0000 mg | ORAL_TABLET | Freq: Every day | ORAL | 0 refills | Status: DC

## 2020-02-23 MED ORDER — AMPHETAMINE-DEXTROAMPHETAMINE 10 MG PO TABS: 10.0000 mg | ORAL_TABLET | Freq: Every day | ORAL | 0 refills | Status: DC

## 2020-04-12 ENCOUNTER — Encounter: Payer: Self-pay | Admitting: Internal Medicine

## 2020-04-12 ENCOUNTER — Other Ambulatory Visit: Payer: Self-pay | Admitting: Internal Medicine

## 2020-04-12 DIAGNOSIS — F900 Attention-deficit hyperactivity disorder, predominantly inattentive type: Secondary | ICD-10-CM

## 2020-04-15 ENCOUNTER — Other Ambulatory Visit: Payer: Self-pay | Admitting: Internal Medicine

## 2020-04-15 DIAGNOSIS — F900 Attention-deficit hyperactivity disorder, predominantly inattentive type: Secondary | ICD-10-CM

## 2020-04-15 MED ORDER — AMPHETAMINE-DEXTROAMPHETAMINE 10 MG PO TABS: 10.0000 mg | ORAL_TABLET | Freq: Every day | ORAL | 0 refills | Status: DC

## 2020-04-15 MED ORDER — AMPHETAMINE-DEXTROAMPHETAMINE 20 MG PO TABS: 20.0000 mg | ORAL_TABLET | Freq: Every day | ORAL | 0 refills | Status: DC

## 2020-05-09 ENCOUNTER — Other Ambulatory Visit: Payer: Self-pay

## 2020-05-10 ENCOUNTER — Ambulatory Visit (INDEPENDENT_AMBULATORY_CARE_PROVIDER_SITE_OTHER): Payer: 59 | Admitting: Internal Medicine

## 2020-05-10 ENCOUNTER — Encounter: Payer: Self-pay | Admitting: Internal Medicine

## 2020-05-10 ENCOUNTER — Other Ambulatory Visit: Payer: Self-pay

## 2020-05-10 VITALS — BP 118/80 | HR 102 | Temp 98.8°F | Ht 69.0 in | Wt 210.6 lb

## 2020-05-10 DIAGNOSIS — E785 Hyperlipidemia, unspecified: Secondary | ICD-10-CM

## 2020-05-10 DIAGNOSIS — E781 Pure hyperglyceridemia: Secondary | ICD-10-CM

## 2020-05-10 DIAGNOSIS — Z0000 Encounter for general adult medical examination without abnormal findings: Secondary | ICD-10-CM

## 2020-05-10 DIAGNOSIS — I10 Essential (primary) hypertension: Secondary | ICD-10-CM

## 2020-05-10 NOTE — Progress Notes (Signed)
Subjective:  Patient ID: Kenneth Haney, male    DOB: July 18, 1980  Age: 40 y.o. MRN: [id number]  CC: Annual Exam, Hypertension, and Hyperlipidemia  This visit occurred during the SARS-CoV-2 public health emergency.  Safety protocols were in place, including screening questions prior to the visit, additional usage of staff PPE, and extensive cleaning of exam room while observing appropriate contact time as indicated for disinfecting solutions.    HPI Kenneth Haney presents for a CPX and f/up -  Pt states ADD status overall stable on current meds with overall good compliance and tolerability, and good effectiveness with respect to ability for concentration and task completion.  He tells me his blood pressure has been well controlled.  Outpatient Medications Prior to Visit  Medication Sig Dispense Refill  . amphetamine-dextroamphetamine (ADDERALL) 10 MG tablet Take 1 tablet (10 mg total) by mouth daily with breakfast. 30 tablet 0  . amphetamine-dextroamphetamine (ADDERALL) 20 MG tablet Take 1 tablet (20 mg total) by mouth daily. 30 tablet 0  . Desvenlafaxine Succinate (PRISTIQ PO) Take by mouth. Pt is not sure of the dose    . irbesartan (AVAPRO) 150 MG tablet TAKE ONE TABLET BY MOUTH DAILY 90 tablet 1  . omega-3 acid ethyl esters (LOVAZA) 1 g capsule Take 2 capsules (2 g total) by mouth 2 (two) times daily. 360 capsule 1  . rosuvastatin (CRESTOR) 20 MG tablet TAKE ONE TABLET BY MOUTH DAILY 90 tablet 1   No facility-administered medications prior to visit.    ROS Review of Systems  Constitutional: Positive for unexpected weight change (wt gain). Negative for chills, diaphoresis and fatigue.  Respiratory: Negative.  Negative for cough, chest tightness, shortness of breath and wheezing.   Cardiovascular: Negative for chest pain, palpitations and leg swelling.  Gastrointestinal: Negative for abdominal pain, constipation, diarrhea, nausea and vomiting.  Genitourinary: Negative.   Negative for difficulty urinating, penile swelling and scrotal swelling.  Musculoskeletal: Negative.  Negative for back pain and myalgias.  Skin: Negative.  Negative for rash.  Neurological: Negative.  Negative for dizziness, weakness, light-headedness and numbness.  Hematological: Negative for adenopathy. Does not bruise/bleed easily.  Psychiatric/Behavioral: Negative for behavioral problems, dysphoric mood and suicidal ideas. The patient is nervous/anxious.     Objective:  BP 118/80 (BP Location: Left Arm, Patient Position: Sitting, Cuff Size: Large)   Pulse (!) 102   Temp 98.8 F (37.1 C) (Oral)   Ht 5\' 9"  (1.753 m)   Wt 210 lb 9.6 oz (95.5 kg)   SpO2 98%   BMI 31.10 kg/m   BP Readings from Last 3 Encounters:  05/10/20 118/80  11/02/19 126/86  03/02/19 132/82    Wt Readings from Last 3 Encounters:  05/10/20 210 lb 9.6 oz (95.5 kg)  11/02/19 197 lb (89.4 kg)  03/02/19 187 lb 6 oz (85 kg)    Physical Exam Vitals reviewed.  Constitutional:      Appearance: Normal appearance.  HENT:     Nose: Nose normal.     Mouth/Throat:     Mouth: Mucous membranes are moist.  Eyes:     General: No scleral icterus.    Conjunctiva/sclera: Conjunctivae normal.  Cardiovascular:     Rate and Rhythm: Normal rate and regular rhythm.     Heart sounds: No murmur heard.   Pulmonary:     Effort: Pulmonary effort is normal.     Breath sounds: No stridor. No wheezing, rhonchi or rales.  Abdominal:     General: Abdomen is flat.  Bowel sounds are normal. There is no distension.     Palpations: Abdomen is soft. There is no hepatomegaly, splenomegaly or mass.  Musculoskeletal:        General: Normal range of motion.     Cervical back: Neck supple.     Right lower leg: No edema.  Lymphadenopathy:     Cervical: No cervical adenopathy.  Skin:    General: Skin is warm and dry.  Neurological:     General: No focal deficit present.     Mental Status: He is alert and oriented to person, place,  and time. Mental status is at baseline.  Psychiatric:        Mood and Affect: Mood normal.        Behavior: Behavior normal.     Lab Results  Component Value Date   WBC 4.3 10/05/2018   HGB 14.1 10/05/2018   HCT 41.3 10/05/2018   PLT 260.0 10/05/2018   GLUCOSE 72 03/02/2019   CHOL 159 03/02/2019   TRIG 113.0 03/02/2019   HDL 59.00 03/02/2019   LDLDIRECT 150.0 07/08/2016   LDLCALC 77 03/02/2019   ALT 52 03/02/2019   AST 29 03/02/2019   NA 138 03/02/2019   K 3.7 03/02/2019   CL 102 03/02/2019   CREATININE 1.14 03/02/2019   BUN 19 03/02/2019   CO2 30 03/02/2019   TSH 1.01 10/05/2018    DG Abd Acute W/Chest  Result Date: 06/26/2016 CLINICAL DATA:  Left lower quadrant pain for 2 days.  Constipation. EXAM: DG ABDOMEN ACUTE W/ 1V CHEST COMPARISON:  None. FINDINGS: Single-view of the chest demonstrates clear lungs and normal heart size. No pneumothorax or pleural effusion. No bony abnormality. Two views of the abdomen show no free intraperitoneal air. The bowel gas pattern is nonobstructive. Moderately large stool burden noted. IMPRESSION: No acute abnormality. Moderately large stool burden. Electronically Signed   By: Inge Rise M.D.   On: 06/26/2016 10:31    Assessment & Plan:   Kenneth Haney was seen today for annual exam, hypertension and hyperlipidemia.  Diagnoses and all orders for this visit:  Essential hypertension, benign- His BP is well controlled.  I will monitor his electrolytes and renal function. -     CBC with Differential/Platelet; Future -     Basic metabolic panel; Future -     TSH; Future  Routine general medical examination at a health care facility- Exam completed, labs reviewed, vaccines reviewed, no cancer screenings are indicated, patient education was given. -     Lipid panel; Future  Hyperlipidemia with target LDL less than 130- Will treat if he has an elevated ASCVD risk score. -     TSH; Future -     Hepatic function panel; Future  Pure  hyperglyceridemia- I will monitor his triglycerides.   I am having Kenneth Haney maintain his Desvenlafaxine Succinate (PRISTIQ PO), irbesartan, rosuvastatin, omega-3 acid ethyl esters, amphetamine-dextroamphetamine, and amphetamine-dextroamphetamine.  No orders of the defined types were placed in this encounter.    Follow-up: No follow-ups on file.  Scarlette Calico, MD

## 2020-05-17 ENCOUNTER — Other Ambulatory Visit: Payer: Self-pay

## 2020-05-17 ENCOUNTER — Other Ambulatory Visit: Payer: Self-pay | Admitting: Internal Medicine

## 2020-05-17 ENCOUNTER — Other Ambulatory Visit (INDEPENDENT_AMBULATORY_CARE_PROVIDER_SITE_OTHER): Payer: 59

## 2020-05-17 DIAGNOSIS — I10 Essential (primary) hypertension: Secondary | ICD-10-CM | POA: Diagnosis not present

## 2020-05-17 DIAGNOSIS — E785 Hyperlipidemia, unspecified: Secondary | ICD-10-CM | POA: Diagnosis not present

## 2020-05-17 DIAGNOSIS — F900 Attention-deficit hyperactivity disorder, predominantly inattentive type: Secondary | ICD-10-CM

## 2020-05-17 DIAGNOSIS — Z0000 Encounter for general adult medical examination without abnormal findings: Secondary | ICD-10-CM

## 2020-05-17 LAB — BASIC METABOLIC PANEL
BUN: 17 mg/dL (ref 6–23)
CO2: 29 mEq/L (ref 19–32)
Calcium: 9.3 mg/dL (ref 8.4–10.5)
Chloride: 103 mEq/L (ref 96–112)
Creatinine, Ser: 1.28 mg/dL (ref 0.40–1.50)
GFR: 71.23 mL/min (ref >60.00)
Glucose, Bld: 79 mg/dL (ref 70–99)
Potassium: 3.7 mEq/L (ref 3.5–5.1)
Sodium: 139 mEq/L (ref 135–145)

## 2020-05-17 LAB — CBC WITH DIFFERENTIAL/PLATELET
Basophils Absolute: 0.1 10*3/uL (ref 0.0–0.1)
Basophils Relative: 1 % (ref 0.0–3.0)
Eosinophils Absolute: 0.1 10*3/uL (ref 0.0–0.7)
Eosinophils Relative: 1.2 % (ref 0.0–5.0)
HCT: 39.5 % (ref 39.0–52.0)
Hemoglobin: 13.5 g/dL (ref 13.0–17.0)
Lymphocytes Relative: 40.4 % (ref 12.0–46.0)
Lymphs Abs: 2.2 10*3/uL (ref 0.7–4.0)
MCHC: 34.1 g/dL (ref 30.0–36.0)
MCV: 88 fl (ref 78.0–100.0)
Monocytes Absolute: 0.5 10*3/uL (ref 0.1–1.0)
Monocytes Relative: 8.5 % (ref 3.0–12.0)
Neutro Abs: 2.6 10*3/uL (ref 1.4–7.7)
Neutrophils Relative %: 48.9 % (ref 43.0–77.0)
Platelets: 274 10*3/uL (ref 150.0–400.0)
RBC: 4.5 Mil/uL (ref 4.22–5.81)
RDW: 13.5 % (ref 11.5–15.5)
WBC: 5.3 10*3/uL (ref 4.0–10.5)

## 2020-05-17 LAB — HEPATIC FUNCTION PANEL
ALT: 45 U/L (ref 0–53)
AST: 24 U/L (ref 0–37)
Albumin: 4.1 g/dL (ref 3.5–5.2)
Alkaline Phosphatase: 46 U/L (ref 39–117)
Bilirubin, Direct: 0.1 mg/dL (ref 0.0–0.3)
Total Bilirubin: 0.6 mg/dL (ref 0.2–1.2)
Total Protein: 7.1 g/dL (ref 6.0–8.3)

## 2020-05-17 LAB — LIPID PANEL
Cholesterol: 137 mg/dL (ref 0–200)
HDL: 43 mg/dL (ref >39.00)
LDL Cholesterol: 62 mg/dL (ref 0–99)
NonHDL: 93.75
Total CHOL/HDL Ratio: 3
Triglycerides: 158 mg/dL — ABNORMAL HIGH (ref 0.0–149.0)
VLDL: 31.6 mg/dL (ref 0.0–40.0)

## 2020-05-17 LAB — TSH: TSH: 1.58 u[IU]/mL (ref 0.35–4.50)

## 2020-05-17 MED ORDER — AMPHETAMINE-DEXTROAMPHETAMINE 20 MG PO TABS
20.0000 mg | ORAL_TABLET | Freq: Every day | ORAL | 0 refills | Status: DC
Start: 2020-05-17 — End: 2020-06-18

## 2020-05-17 MED ORDER — AMPHETAMINE-DEXTROAMPHETAMINE 10 MG PO TABS: 10.0000 mg | ORAL_TABLET | Freq: Every day | ORAL | 0 refills | Status: DC

## 2020-05-17 NOTE — Telephone Encounter (Signed)
Requesting: Adderall Last Visit: 05/10/20 Next Visit: none Last Refill: 04/15/20 Please Advise

## 2020-06-18 ENCOUNTER — Other Ambulatory Visit: Payer: Self-pay | Admitting: Internal Medicine

## 2020-06-18 DIAGNOSIS — F900 Attention-deficit hyperactivity disorder, predominantly inattentive type: Secondary | ICD-10-CM

## 2020-06-18 MED ORDER — AMPHETAMINE-DEXTROAMPHETAMINE 20 MG PO TABS
20.0000 mg | ORAL_TABLET | Freq: Every day | ORAL | 0 refills | Status: DC
Start: 2020-06-18 — End: 2020-07-18

## 2020-06-18 MED ORDER — AMPHETAMINE-DEXTROAMPHETAMINE 10 MG PO TABS: 10.0000 mg | ORAL_TABLET | Freq: Every day | ORAL | 0 refills | Status: DC

## 2020-07-18 ENCOUNTER — Other Ambulatory Visit: Payer: Self-pay | Admitting: Internal Medicine

## 2020-07-18 DIAGNOSIS — F900 Attention-deficit hyperactivity disorder, predominantly inattentive type: Secondary | ICD-10-CM

## 2020-07-18 MED ORDER — AMPHETAMINE-DEXTROAMPHETAMINE 10 MG PO TABS: 10.0000 mg | ORAL_TABLET | Freq: Every day | ORAL | 0 refills | Status: DC

## 2020-07-18 MED ORDER — AMPHETAMINE-DEXTROAMPHETAMINE 20 MG PO TABS
20.0000 mg | ORAL_TABLET | Freq: Every day | ORAL | 0 refills | Status: DC
Start: 2020-07-18 — End: 2020-08-17

## 2020-08-17 ENCOUNTER — Other Ambulatory Visit: Payer: Self-pay | Admitting: Internal Medicine

## 2020-08-17 DIAGNOSIS — F900 Attention-deficit hyperactivity disorder, predominantly inattentive type: Secondary | ICD-10-CM

## 2020-08-17 MED ORDER — AMPHETAMINE-DEXTROAMPHETAMINE 10 MG PO TABS: 10.0000 mg | ORAL_TABLET | Freq: Every day | ORAL | 0 refills | Status: DC

## 2020-08-17 MED ORDER — AMPHETAMINE-DEXTROAMPHETAMINE 20 MG PO TABS
20.0000 mg | ORAL_TABLET | Freq: Every day | ORAL | 0 refills | Status: DC
Start: 2020-08-17 — End: 2020-09-17

## 2020-09-17 ENCOUNTER — Encounter: Payer: Self-pay | Admitting: Internal Medicine

## 2020-09-17 ENCOUNTER — Other Ambulatory Visit: Payer: Self-pay | Admitting: Internal Medicine

## 2020-09-17 DIAGNOSIS — F900 Attention-deficit hyperactivity disorder, predominantly inattentive type: Secondary | ICD-10-CM

## 2020-09-17 MED ORDER — AMPHETAMINE-DEXTROAMPHETAMINE 10 MG PO TABS: 10.0000 mg | ORAL_TABLET | Freq: Every day | ORAL | 0 refills | Status: DC

## 2020-09-17 MED ORDER — AMPHETAMINE-DEXTROAMPHETAMINE 20 MG PO TABS: 20.0000 mg | ORAL_TABLET | Freq: Every day | ORAL | 0 refills | Status: DC

## 2020-10-09 ENCOUNTER — Other Ambulatory Visit: Payer: Self-pay | Admitting: Internal Medicine

## 2020-10-09 DIAGNOSIS — I10 Essential (primary) hypertension: Secondary | ICD-10-CM

## 2020-10-09 DIAGNOSIS — E785 Hyperlipidemia, unspecified: Secondary | ICD-10-CM

## 2020-10-18 ENCOUNTER — Other Ambulatory Visit: Payer: Self-pay | Admitting: Internal Medicine

## 2020-10-18 DIAGNOSIS — F900 Attention-deficit hyperactivity disorder, predominantly inattentive type: Secondary | ICD-10-CM

## 2020-10-18 MED ORDER — AMPHETAMINE-DEXTROAMPHETAMINE 10 MG PO TABS: 10.0000 mg | ORAL_TABLET | Freq: Every day | ORAL | 0 refills | Status: DC

## 2020-10-18 MED ORDER — AMPHETAMINE-DEXTROAMPHETAMINE 20 MG PO TABS: 20.0000 mg | ORAL_TABLET | Freq: Every day | ORAL | 0 refills | Status: DC

## 2020-10-22 ENCOUNTER — Other Ambulatory Visit: Payer: Self-pay | Admitting: Internal Medicine

## 2020-10-22 DIAGNOSIS — E781 Pure hyperglyceridemia: Secondary | ICD-10-CM

## 2020-11-16 ENCOUNTER — Other Ambulatory Visit: Payer: Self-pay | Admitting: Internal Medicine

## 2020-11-16 DIAGNOSIS — F900 Attention-deficit hyperactivity disorder, predominantly inattentive type: Secondary | ICD-10-CM

## 2020-11-16 MED ORDER — AMPHETAMINE-DEXTROAMPHETAMINE 20 MG PO TABS: 20.0000 mg | ORAL_TABLET | Freq: Every day | ORAL | 0 refills | Status: DC

## 2020-11-16 MED ORDER — AMPHETAMINE-DEXTROAMPHETAMINE 10 MG PO TABS: 10.0000 mg | ORAL_TABLET | Freq: Every day | ORAL | 0 refills | Status: DC

## 2020-12-24 ENCOUNTER — Other Ambulatory Visit: Payer: Self-pay

## 2020-12-24 ENCOUNTER — Ambulatory Visit (INDEPENDENT_AMBULATORY_CARE_PROVIDER_SITE_OTHER): Payer: 59 | Admitting: Internal Medicine

## 2020-12-24 ENCOUNTER — Encounter: Payer: Self-pay | Admitting: Internal Medicine

## 2020-12-24 VITALS — BP 124/86 | HR 83 | Temp 98.4°F | Resp 16 | Wt 216.6 lb

## 2020-12-24 DIAGNOSIS — Z1159 Encounter for screening for other viral diseases: Secondary | ICD-10-CM | POA: Insufficient documentation

## 2020-12-24 DIAGNOSIS — I10 Essential (primary) hypertension: Secondary | ICD-10-CM | POA: Diagnosis not present

## 2020-12-24 DIAGNOSIS — Z23 Encounter for immunization: Secondary | ICD-10-CM | POA: Diagnosis not present

## 2020-12-24 DIAGNOSIS — F900 Attention-deficit hyperactivity disorder, predominantly inattentive type: Secondary | ICD-10-CM | POA: Diagnosis not present

## 2020-12-24 LAB — CBC WITH DIFFERENTIAL/PLATELET
Basophils Absolute: 0 10*3/uL (ref 0.0–0.1)
Basophils Relative: 0.9 % (ref 0.0–3.0)
Eosinophils Absolute: 0.1 10*3/uL (ref 0.0–0.7)
Eosinophils Relative: 1.2 % (ref 0.0–5.0)
HCT: 38.8 % — ABNORMAL LOW (ref 39.0–52.0)
Hemoglobin: 13.4 g/dL (ref 13.0–17.0)
Lymphocytes Relative: 32.8 % (ref 12.0–46.0)
Lymphs Abs: 1.4 10*3/uL (ref 0.7–4.0)
MCHC: 34.4 g/dL (ref 30.0–36.0)
MCV: 87 fl (ref 78.0–100.0)
Monocytes Absolute: 0.3 10*3/uL (ref 0.1–1.0)
Monocytes Relative: 7.6 % (ref 3.0–12.0)
Neutro Abs: 2.5 10*3/uL (ref 1.4–7.7)
Neutrophils Relative %: 57.5 % (ref 43.0–77.0)
Platelets: 271 10*3/uL (ref 150.0–400.0)
RBC: 4.46 Mil/uL (ref 4.22–5.81)
RDW: 13.9 % (ref 11.5–15.5)
WBC: 4.4 10*3/uL (ref 4.0–10.5)

## 2020-12-24 LAB — BASIC METABOLIC PANEL
BUN: 16 mg/dL (ref 6–23)
CO2: 30 mEq/L (ref 19–32)
Calcium: 9.4 mg/dL (ref 8.4–10.5)
Chloride: 101 mEq/L (ref 96–112)
Creatinine, Ser: 1.17 mg/dL (ref 0.40–1.50)
GFR: 79.01 mL/min (ref >60.00)
Glucose, Bld: 92 mg/dL (ref 70–99)
Potassium: 4 mEq/L (ref 3.5–5.1)
Sodium: 138 mEq/L (ref 135–145)

## 2020-12-24 MED ORDER — AMPHETAMINE-DEXTROAMPHETAMINE 10 MG PO TABS: 10.0000 mg | ORAL_TABLET | Freq: Every day | ORAL | 0 refills | Status: DC

## 2020-12-24 MED ORDER — AMPHETAMINE-DEXTROAMPHETAMINE 20 MG PO TABS: 20.0000 mg | ORAL_TABLET | Freq: Every day | ORAL | 0 refills | Status: DC

## 2020-12-24 NOTE — Progress Notes (Signed)
Subjective:  Patient ID: Kenneth Haney, male    DOB: 08/04/1980  Age: 40 y.o. MRN: [id number]  CC: Hypertension  This visit occurred during the SARS-CoV-2 public health emergency.  Safety protocols were in place, including screening questions prior to the visit, additional usage of staff PPE, and extensive cleaning of exam room while observing appropriate contact time as indicated for disinfecting solutions.    HPI Kenneth Haney presents for f/up -  Pt states ADD status overall stable on current meds with overall good compliance and tolerability, and good effectiveness with respect to ability for concentration and task completion.  His blood pressure has been well controlled.  He denies chest pain, shortness of breath, headache, or edema.  Outpatient Medications Prior to Visit  Medication Sig Dispense Refill   Desvenlafaxine Succinate (PRISTIQ PO) Take by mouth. Pt is not sure of the dose     irbesartan (AVAPRO) 150 MG tablet TAKE ONE TABLET BY MOUTH DAILY 90 tablet 0   omega-3 acid ethyl esters (LOVAZA) 1 g capsule TAKE TWO CAPSULES BY MOUTH TWICE A DAY 360 capsule 1   rosuvastatin (CRESTOR) 20 MG tablet TAKE ONE TABLET BY MOUTH DAILY 90 tablet 1   amphetamine-dextroamphetamine (ADDERALL) 20 MG tablet Take 1 tablet (20 mg total) by mouth daily. 30 tablet 0   amphetamine-dextroamphetamine (ADDERALL) 10 MG tablet Take 1 tablet (10 mg total) by mouth daily with breakfast. 30 tablet 0   No facility-administered medications prior to visit.    ROS Review of Systems  Constitutional:  Negative for chills, diaphoresis, fatigue and fever.  HENT: Negative.    Eyes: Negative.   Respiratory:  Negative for cough, chest tightness, shortness of breath and wheezing.   Cardiovascular:  Negative for chest pain, palpitations and leg swelling.  Gastrointestinal:  Negative for abdominal pain, constipation, diarrhea and vomiting.  Endocrine: Negative.   Genitourinary: Negative.  Negative for  difficulty urinating.  Musculoskeletal: Negative.   Skin: Negative.  Negative for color change.  Neurological:  Negative for dizziness, weakness, light-headedness and numbness.  Hematological:  Negative for adenopathy. Does not bruise/bleed easily.  Psychiatric/Behavioral:  Positive for decreased concentration. Negative for dysphoric mood and sleep disturbance. The patient is not nervous/anxious.    Objective:  BP 124/86 (BP Location: Left Arm, Patient Position: Sitting, Cuff Size: Large)   Pulse 83   Temp 98.4 F (36.9 C) (Oral)   Resp 16   Wt 216 lb 9.6 oz (98.2 kg)   SpO2 98%   BMI 31.99 kg/m   BP Readings from Last 3 Encounters:  12/24/20 124/86  05/10/20 118/80  11/02/19 126/86    Wt Readings from Last 3 Encounters:  12/24/20 216 lb 9.6 oz (98.2 kg)  05/10/20 210 lb 9.6 oz (95.5 kg)  11/02/19 197 lb (89.4 kg)    Physical Exam Vitals reviewed.  HENT:     Nose: Nose normal.     Mouth/Throat:     Mouth: Mucous membranes are moist.  Eyes:     General: No scleral icterus.    Conjunctiva/sclera: Conjunctivae normal.  Cardiovascular:     Rate and Rhythm: Normal rate.     Heart sounds: No murmur heard. Pulmonary:     Effort: Pulmonary effort is normal.     Breath sounds: No stridor. No wheezing, rhonchi or rales.  Abdominal:     General: Abdomen is flat.     Palpations: There is no mass.     Tenderness: There is no abdominal tenderness. There is  no guarding.     Hernia: No hernia is present.  Musculoskeletal:        General: Normal range of motion.     Cervical back: Neck supple.     Right lower leg: No edema.     Left lower leg: No edema.  Lymphadenopathy:     Cervical: No cervical adenopathy.  Skin:    General: Skin is warm and dry.  Neurological:     General: No focal deficit present.     Mental Status: He is alert.  Psychiatric:        Mood and Affect: Mood normal.        Behavior: Behavior normal.    Lab Results  Component Value Date   WBC 4.4  12/24/2020   HGB 13.4 12/24/2020   HCT 38.8 (L) 12/24/2020   PLT 271.0 12/24/2020   GLUCOSE 92 12/24/2020   CHOL 137 05/17/2020   TRIG 158.0 (H) 05/17/2020   HDL 43.00 05/17/2020   LDLDIRECT 150.0 07/08/2016   LDLCALC 62 05/17/2020   ALT 45 05/17/2020   AST 24 05/17/2020   NA 138 12/24/2020   K 4.0 12/24/2020   CL 101 12/24/2020   CREATININE 1.17 12/24/2020   BUN 16 12/24/2020   CO2 30 12/24/2020   TSH 1.58 05/17/2020    DG Abd Acute W/Chest  Result Date: 06/26/2016 CLINICAL DATA:  Left lower quadrant pain for 2 days.  Constipation. EXAM: DG ABDOMEN ACUTE W/ 1V CHEST COMPARISON:  None. FINDINGS: Single-view of the chest demonstrates clear lungs and normal heart size. No pneumothorax or pleural effusion. No bony abnormality. Two views of the abdomen show no free intraperitoneal air. The bowel gas pattern is nonobstructive. Moderately large stool burden noted. IMPRESSION: No acute abnormality. Moderately large stool burden. Electronically Signed   By: Inge Rise M.D.   On: 06/26/2016 10:31    Assessment & Plan:   Tavarious was seen today for hypertension.  Diagnoses and all orders for this visit:  Need for hepatitis C screening test -     Hepatitis C antibody; Future -     Hepatitis C antibody  Essential hypertension, benign- His blood pressure is well controlled.  Electrolytes and renal function are normal. -     CBC with Differential/Platelet; Future -     Basic metabolic panel; Future -     Basic metabolic panel -     CBC with Differential/Platelet  Attention deficit hyperactivity disorder (ADHD), predominantly inattentive type -     amphetamine-dextroamphetamine (ADDERALL) 10 MG tablet; Take 1 tablet (10 mg total) by mouth daily with breakfast. -     amphetamine-dextroamphetamine (ADDERALL) 20 MG tablet; Take 1 tablet (20 mg total) by mouth daily.  Other orders -     Flu Vaccine QUAD 6+ mos PF IM (Fluarix Quad PF)  I am having Ewing R. Harbeson maintain his  Desvenlafaxine Succinate (PRISTIQ PO), irbesartan, rosuvastatin, omega-3 acid ethyl esters, amphetamine-dextroamphetamine, and amphetamine-dextroamphetamine.  Meds ordered this encounter  Medications   amphetamine-dextroamphetamine (ADDERALL) 10 MG tablet    Sig: Take 1 tablet (10 mg total) by mouth daily with breakfast.    Dispense:  30 tablet    Refill:  0   amphetamine-dextroamphetamine (ADDERALL) 20 MG tablet    Sig: Take 1 tablet (20 mg total) by mouth daily.    Dispense:  30 tablet    Refill:  0      Follow-up: Return in about 6 months (around 06/23/2021).  Scarlette Calico, MD

## 2020-12-24 NOTE — Patient Instructions (Signed)

## 2020-12-25 LAB — HEPATITIS C ANTIBODY
Hepatitis C Ab: NONREACTIVE
SIGNAL TO CUT-OFF: 0.04 (ref <1.00)

## 2020-12-30 ENCOUNTER — Encounter: Payer: Self-pay | Admitting: Internal Medicine

## 2020-12-30 DIAGNOSIS — E781 Pure hyperglyceridemia: Secondary | ICD-10-CM

## 2020-12-31 ENCOUNTER — Other Ambulatory Visit: Payer: Self-pay | Admitting: Internal Medicine

## 2020-12-31 DIAGNOSIS — E781 Pure hyperglyceridemia: Secondary | ICD-10-CM

## 2020-12-31 MED ORDER — OMEGA-3-ACID ETHYL ESTERS 1 G PO CAPS: 2.0000 | ORAL_CAPSULE | Freq: Two times a day (BID) | ORAL | 1 refills | Status: DC

## 2021-01-03 ENCOUNTER — Other Ambulatory Visit: Payer: Self-pay | Admitting: Internal Medicine

## 2021-01-03 DIAGNOSIS — I10 Essential (primary) hypertension: Secondary | ICD-10-CM

## 2021-01-03 NOTE — Telephone Encounter (Signed)
Please refill as per office routine med refill policy (all routine meds to be refilled for 3 mo or monthly (per pt preference) up to one year from last visit, then month to month grace period for 3 mo, then further med refills will have to be denied) ? ?

## 2021-01-24 ENCOUNTER — Other Ambulatory Visit: Payer: Self-pay | Admitting: Internal Medicine

## 2021-01-24 DIAGNOSIS — F900 Attention-deficit hyperactivity disorder, predominantly inattentive type: Secondary | ICD-10-CM

## 2021-01-24 MED ORDER — AMPHETAMINE-DEXTROAMPHETAMINE 20 MG PO TABS
20.0000 mg | ORAL_TABLET | Freq: Every day | ORAL | 0 refills | Status: DC
Start: 2021-01-24 — End: 2021-02-22

## 2021-01-24 MED ORDER — AMPHETAMINE-DEXTROAMPHETAMINE 10 MG PO TABS: 10.0000 mg | ORAL_TABLET | Freq: Every day | ORAL | 0 refills | Status: DC

## 2021-02-22 ENCOUNTER — Other Ambulatory Visit: Payer: Self-pay | Admitting: Internal Medicine

## 2021-02-22 DIAGNOSIS — F900 Attention-deficit hyperactivity disorder, predominantly inattentive type: Secondary | ICD-10-CM

## 2021-02-25 MED ORDER — AMPHETAMINE-DEXTROAMPHETAMINE 10 MG PO TABS: 10.0000 mg | ORAL_TABLET | Freq: Every day | ORAL | 0 refills | Status: DC

## 2021-02-25 MED ORDER — AMPHETAMINE-DEXTROAMPHETAMINE 20 MG PO TABS: 20.0000 mg | ORAL_TABLET | Freq: Every day | ORAL | 0 refills | Status: DC

## 2021-04-01 ENCOUNTER — Other Ambulatory Visit: Payer: Self-pay | Admitting: Internal Medicine

## 2021-04-01 DIAGNOSIS — F900 Attention-deficit hyperactivity disorder, predominantly inattentive type: Secondary | ICD-10-CM

## 2021-04-01 MED ORDER — AMPHETAMINE-DEXTROAMPHETAMINE 20 MG PO TABS: 20.0000 mg | ORAL_TABLET | Freq: Every day | ORAL | 0 refills | Status: DC

## 2021-04-01 MED ORDER — AMPHETAMINE-DEXTROAMPHETAMINE 10 MG PO TABS: 10.0000 mg | ORAL_TABLET | Freq: Every day | ORAL | 0 refills | Status: DC

## 2021-04-05 ENCOUNTER — Other Ambulatory Visit: Payer: Self-pay | Admitting: Internal Medicine

## 2021-04-05 DIAGNOSIS — E785 Hyperlipidemia, unspecified: Secondary | ICD-10-CM

## 2021-04-09 ENCOUNTER — Other Ambulatory Visit: Payer: Self-pay | Admitting: Internal Medicine

## 2021-04-09 ENCOUNTER — Encounter: Payer: Self-pay | Admitting: Internal Medicine

## 2021-04-09 DIAGNOSIS — I10 Essential (primary) hypertension: Secondary | ICD-10-CM

## 2021-04-09 DIAGNOSIS — E785 Hyperlipidemia, unspecified: Secondary | ICD-10-CM

## 2021-04-09 MED ORDER — ROSUVASTATIN CALCIUM 20 MG PO TABS: 20.0000 mg | ORAL_TABLET | Freq: Every day | ORAL | 0 refills | Status: DC

## 2021-04-09 MED ORDER — IRBESARTAN 150 MG PO TABS: 150.0000 mg | ORAL_TABLET | Freq: Every day | ORAL | 0 refills | Status: DC

## 2021-05-22 ENCOUNTER — Other Ambulatory Visit: Payer: Self-pay | Admitting: Internal Medicine

## 2021-05-22 DIAGNOSIS — F900 Attention-deficit hyperactivity disorder, predominantly inattentive type: Secondary | ICD-10-CM

## 2021-05-22 NOTE — Telephone Encounter (Signed)
LOV: 12/24/20 ?

## 2021-05-23 MED ORDER — AMPHETAMINE-DEXTROAMPHETAMINE 10 MG PO TABS: 10.0000 mg | ORAL_TABLET | Freq: Every day | ORAL | 0 refills | Status: DC

## 2021-05-23 MED ORDER — AMPHETAMINE-DEXTROAMPHETAMINE 20 MG PO TABS: 20.0000 mg | ORAL_TABLET | Freq: Every day | ORAL | 0 refills | Status: DC

## 2021-06-27 ENCOUNTER — Other Ambulatory Visit: Payer: Self-pay | Admitting: Internal Medicine

## 2021-06-27 DIAGNOSIS — F900 Attention-deficit hyperactivity disorder, predominantly inattentive type: Secondary | ICD-10-CM

## 2021-06-27 MED ORDER — AMPHETAMINE-DEXTROAMPHETAMINE 10 MG PO TABS: 10.0000 mg | ORAL_TABLET | Freq: Every day | ORAL | 0 refills | Status: DC

## 2021-06-27 MED ORDER — AMPHETAMINE-DEXTROAMPHETAMINE 20 MG PO TABS: 20.0000 mg | ORAL_TABLET | Freq: Every day | ORAL | 0 refills | Status: DC

## 2021-06-27 NOTE — Telephone Encounter (Signed)
LOV: 12/24/20

## 2021-07-04 ENCOUNTER — Other Ambulatory Visit: Payer: Self-pay | Admitting: Internal Medicine

## 2021-07-04 DIAGNOSIS — I10 Essential (primary) hypertension: Secondary | ICD-10-CM

## 2021-08-12 ENCOUNTER — Other Ambulatory Visit: Payer: Self-pay | Admitting: Internal Medicine

## 2021-08-12 DIAGNOSIS — F900 Attention-deficit hyperactivity disorder, predominantly inattentive type: Secondary | ICD-10-CM

## 2021-08-12 MED ORDER — AMPHETAMINE-DEXTROAMPHETAMINE 10 MG PO TABS: 10.0000 mg | ORAL_TABLET | Freq: Every day | ORAL | 0 refills | Status: DC

## 2021-08-12 MED ORDER — AMPHETAMINE-DEXTROAMPHETAMINE 20 MG PO TABS: 20.0000 mg | ORAL_TABLET | Freq: Every day | ORAL | 0 refills | Status: DC

## 2021-08-12 NOTE — Telephone Encounter (Signed)
Check [place] registry last filled 07/12/2021 & Adderall '10mg'$  07/07/21.Marland KitchenJohny Haney

## 2021-09-19 ENCOUNTER — Other Ambulatory Visit: Payer: Self-pay | Admitting: Internal Medicine

## 2021-09-19 ENCOUNTER — Encounter: Payer: Self-pay | Admitting: Internal Medicine

## 2021-09-19 DIAGNOSIS — F900 Attention-deficit hyperactivity disorder, predominantly inattentive type: Secondary | ICD-10-CM

## 2021-09-19 MED ORDER — AMPHETAMINE-DEXTROAMPHETAMINE 10 MG PO TABS: 10.0000 mg | ORAL_TABLET | Freq: Every day | ORAL | 0 refills | Status: DC

## 2021-09-19 MED ORDER — AMPHETAMINE-DEXTROAMPHETAMINE 20 MG PO TABS: 20.0000 mg | ORAL_TABLET | Freq: Every day | ORAL | 0 refills | Status: DC

## 2021-09-19 NOTE — Telephone Encounter (Signed)
Check [name] registry last filled both 08/12/2021.Marland KitchenJohny Haney

## 2021-10-01 ENCOUNTER — Other Ambulatory Visit: Payer: Self-pay | Admitting: Internal Medicine

## 2021-10-01 DIAGNOSIS — I10 Essential (primary) hypertension: Secondary | ICD-10-CM

## 2021-10-22 ENCOUNTER — Ambulatory Visit (INDEPENDENT_AMBULATORY_CARE_PROVIDER_SITE_OTHER): Payer: 59 | Admitting: Internal Medicine

## 2021-10-22 ENCOUNTER — Encounter: Payer: Self-pay | Admitting: Internal Medicine

## 2021-10-22 VITALS — BP 128/78 | HR 82 | Temp 97.7°F | Ht 69.0 in | Wt 222.0 lb

## 2021-10-22 DIAGNOSIS — D539 Nutritional anemia, unspecified: Secondary | ICD-10-CM | POA: Diagnosis not present

## 2021-10-22 DIAGNOSIS — E781 Pure hyperglyceridemia: Secondary | ICD-10-CM

## 2021-10-22 DIAGNOSIS — F900 Attention-deficit hyperactivity disorder, predominantly inattentive type: Secondary | ICD-10-CM | POA: Diagnosis not present

## 2021-10-22 DIAGNOSIS — I10 Essential (primary) hypertension: Secondary | ICD-10-CM | POA: Diagnosis not present

## 2021-10-22 DIAGNOSIS — Z23 Encounter for immunization: Secondary | ICD-10-CM

## 2021-10-22 DIAGNOSIS — Z0000 Encounter for general adult medical examination without abnormal findings: Secondary | ICD-10-CM | POA: Diagnosis not present

## 2021-10-22 DIAGNOSIS — Z0001 Encounter for general adult medical examination with abnormal findings: Secondary | ICD-10-CM

## 2021-10-22 NOTE — Progress Notes (Signed)
Subjective:  Patient ID: Kenneth Haney, male    DOB: [birth year]  Age: 41 y.o. MRN: [id number]  CC: Hyperlipidemia, Hypertension, and Annual Exam   HPI Kenneth Haney presents for a CPX and f/up -  He is doing well on the current dose of Adderall and would like to continue.  He tells me his blood pressure has been well controlled.  He has rare dizziness but denies chest pain, shortness of breath, or edema.  Outpatient Medications Prior to Visit  Medication Sig Dispense Refill   Desvenlafaxine Succinate (PRISTIQ PO) Take by mouth. Pt is not sure of the dose     irbesartan (AVAPRO) 150 MG tablet TAKE 1 TABLET BY MOUTH DAILY 90 tablet 0   omega-3 acid ethyl esters (LOVAZA) 1 g capsule Take 2 capsules (2 g total) by mouth 2 (two) times daily. 360 capsule 1   rosuvastatin (CRESTOR) 20 MG tablet Take 1 tablet (20 mg total) by mouth daily. 90 tablet 0   amphetamine-dextroamphetamine (ADDERALL) 20 MG tablet Take 1 tablet (20 mg total) by mouth daily. 30 tablet 0   amphetamine-dextroamphetamine (ADDERALL) 10 MG tablet Take 1 tablet (10 mg total) by mouth daily with breakfast. 30 tablet 0   No facility-administered medications prior to visit.    ROS Review of Systems  Constitutional:  Positive for unexpected weight change (wt gain). Negative for appetite change, chills, diaphoresis and fatigue.  HENT: Negative.    Eyes: Negative.   Respiratory:  Negative for cough, chest tightness, shortness of breath and wheezing.   Cardiovascular:  Negative for chest pain, palpitations and leg swelling.  Gastrointestinal: Negative.  Negative for abdominal pain, blood in stool, constipation, diarrhea, nausea and vomiting.  Endocrine: Negative.   Genitourinary: Negative.  Negative for difficulty urinating.  Musculoskeletal: Negative.  Negative for arthralgias and myalgias.  Skin: Negative.   Neurological:  Positive for dizziness.  Psychiatric/Behavioral:  Negative for confusion, decreased  concentration, dysphoric mood and sleep disturbance. The patient is nervous/anxious.     Objective:  BP 128/78 (BP Location: Right Arm, Patient Position: Sitting, Cuff Size: Normal)   Pulse 82   Temp 97.7 F (36.5 C) (Oral)   Ht '5\' 9"'$  (1.753 m)   Wt 222 lb (100.7 kg)   SpO2 92%   BMI 32.78 kg/m   BP Readings from Last 3 Encounters:  10/22/21 128/78  12/24/20 124/86  05/10/20 118/80    Wt Readings from Last 3 Encounters:  10/22/21 222 lb (100.7 kg)  12/24/20 216 lb 9.6 oz (98.2 kg)  05/10/20 210 lb 9.6 oz (95.5 kg)    Physical Exam Vitals reviewed.  HENT:     Nose: Nose normal.     Mouth/Throat:     Mouth: Mucous membranes are moist.  Eyes:     General: No scleral icterus.    Conjunctiva/sclera: Conjunctivae normal.  Cardiovascular:     Rate and Rhythm: Normal rate and regular rhythm.     Pulses: Normal pulses.     Heart sounds: No murmur heard.    No friction rub. No gallop.  Pulmonary:     Effort: Pulmonary effort is normal.     Breath sounds: No stridor. No wheezing, rhonchi or rales.  Abdominal:     General: Abdomen is flat.     Palpations: There is no mass.     Tenderness: There is no abdominal tenderness. There is no guarding.     Hernia: No hernia is present.  Musculoskeletal:  General: Normal range of motion.     Cervical back: Neck supple.     Right lower leg: No edema.     Left lower leg: No edema.  Lymphadenopathy:     Cervical: No cervical adenopathy.  Skin:    General: Skin is warm and dry.  Neurological:     General: No focal deficit present.     Mental Status: He is alert. Mental status is at baseline.  Psychiatric:        Mood and Affect: Mood normal.        Behavior: Behavior normal.        Thought Content: Thought content normal.        Judgment: Judgment normal.     Lab Results  Component Value Date   WBC 4.4 12/24/2020   HGB 13.4 12/24/2020   HCT 38.8 (L) 12/24/2020   PLT 271.0 12/24/2020   GLUCOSE 92 12/24/2020    CHOL 137 05/17/2020   TRIG 158.0 (H) 05/17/2020   HDL 43.00 05/17/2020   LDLDIRECT 150.0 07/08/2016   LDLCALC 62 05/17/2020   ALT 45 05/17/2020   AST 24 05/17/2020   NA 138 12/24/2020   K 4.0 12/24/2020   CL 101 12/24/2020   CREATININE 1.17 12/24/2020   BUN 16 12/24/2020   CO2 30 12/24/2020   TSH 1.58 05/17/2020    DG Abd Acute W/Chest  Result Date: 06/26/2016 CLINICAL DATA:  Left lower quadrant pain for 2 days.  Constipation. EXAM: DG ABDOMEN ACUTE W/ 1V CHEST COMPARISON:  None. FINDINGS: Single-view of the chest demonstrates clear lungs and normal heart size. No pneumothorax or pleural effusion. No bony abnormality. Two views of the abdomen show no free intraperitoneal air. The bowel gas pattern is nonobstructive. Moderately large stool burden noted. IMPRESSION: No acute abnormality. Moderately large stool burden. Electronically Signed   By: Inge Rise M.D.   On: 06/26/2016 10:31    Assessment & Plan:   Kenneth Haney was seen today for hyperlipidemia, hypertension and annual exam.  Diagnoses and all orders for this visit:  Deficiency anemia- I will recheck his H&H and will evaluate for vitamin deficiencies. -     Vitamin B12; Future -     IBC + Ferritin; Future -     Folate; Future -     CBC with Differential/Platelet; Future -     Vitamin B1; Future -     Zinc; Future  Attention deficit hyperactivity disorder (ADHD), predominantly inattentive type -     amphetamine-dextroamphetamine (ADDERALL) 20 MG tablet; Take 1 tablet (20 mg total) by mouth daily. -     amphetamine-dextroamphetamine (ADDERALL) 10 MG tablet; Take 1 tablet (10 mg total) by mouth daily with breakfast.  Essential hypertension, benign- His blood pressure is adequately well controlled. -     Basic metabolic panel; Future  Pure hyperglyceridemia -     Lipid panel; Future  Encounter for general adult medical examination with abnormal findings- Exam completed, labs reviewed, vaccines reviewed and updated, no  cancer screenings indicated, patient education was given.  Other orders -     Flu Vaccine QUAD 6+ mos PF IM (Fluarix Quad PF)   I am having Kenneth Haney maintain his Desvenlafaxine Succinate (PRISTIQ PO), omega-3 acid ethyl esters, rosuvastatin, irbesartan, amphetamine-dextroamphetamine, and amphetamine-dextroamphetamine.  Meds ordered this encounter  Medications   amphetamine-dextroamphetamine (ADDERALL) 20 MG tablet    Sig: Take 1 tablet (20 mg total) by mouth daily.    Dispense:  30 tablet  Refill:  0   amphetamine-dextroamphetamine (ADDERALL) 10 MG tablet    Sig: Take 1 tablet (10 mg total) by mouth daily with breakfast.    Dispense:  30 tablet    Refill:  0     Follow-up: No follow-ups on file.  Scarlette Calico, MD

## 2021-10-24 ENCOUNTER — Other Ambulatory Visit: Payer: Self-pay | Admitting: Internal Medicine

## 2021-10-24 DIAGNOSIS — F900 Attention-deficit hyperactivity disorder, predominantly inattentive type: Secondary | ICD-10-CM

## 2021-10-24 MED ORDER — AMPHETAMINE-DEXTROAMPHETAMINE 20 MG PO TABS: 20.0000 mg | ORAL_TABLET | Freq: Every day | ORAL | 0 refills | Status: DC

## 2021-10-24 MED ORDER — AMPHETAMINE-DEXTROAMPHETAMINE 10 MG PO TABS: 10.0000 mg | ORAL_TABLET | Freq: Every day | ORAL | 0 refills | Status: DC

## 2021-10-26 DIAGNOSIS — Z0001 Encounter for general adult medical examination with abnormal findings: Secondary | ICD-10-CM | POA: Insufficient documentation

## 2021-10-28 ENCOUNTER — Other Ambulatory Visit: Payer: Self-pay | Admitting: Internal Medicine

## 2021-10-28 DIAGNOSIS — D518 Other vitamin B12 deficiency anemias: Secondary | ICD-10-CM | POA: Insufficient documentation

## 2021-10-28 DIAGNOSIS — D528 Other folate deficiency anemias: Secondary | ICD-10-CM | POA: Insufficient documentation

## 2021-10-28 LAB — CBC WITH DIFFERENTIAL/PLATELET
Basophils Absolute: 0.1 10*3/uL (ref 0.0–0.1)
Basophils Relative: 0.9 % (ref 0.0–3.0)
Eosinophils Absolute: 0.1 10*3/uL (ref 0.0–0.7)
Eosinophils Relative: 1.7 % (ref 0.0–5.0)
HCT: 37.5 % — ABNORMAL LOW (ref 39.0–52.0)
Hemoglobin: 13.1 g/dL (ref 13.0–17.0)
Lymphocytes Relative: 47.5 % — ABNORMAL HIGH (ref 12.0–46.0)
Lymphs Abs: 2.8 10*3/uL (ref 0.7–4.0)
MCHC: 35 g/dL (ref 30.0–36.0)
MCV: 86.8 fl (ref 78.0–100.0)
Monocytes Absolute: 0.4 10*3/uL (ref 0.1–1.0)
Monocytes Relative: 7.4 % (ref 3.0–12.0)
Neutro Abs: 2.5 10*3/uL (ref 1.4–7.7)
Neutrophils Relative %: 42.5 % — ABNORMAL LOW (ref 43.0–77.0)
Platelets: 256 10*3/uL (ref 150.0–400.0)
RBC: 4.32 Mil/uL (ref 4.22–5.81)
RDW: 13.5 % (ref 11.5–15.5)
WBC: 6 10*3/uL (ref 4.0–10.5)

## 2021-10-28 LAB — BASIC METABOLIC PANEL
BUN: 21 mg/dL (ref 6–23)
CO2: 29 mEq/L (ref 19–32)
Calcium: 9.3 mg/dL (ref 8.4–10.5)
Chloride: 102 mEq/L (ref 96–112)
Creatinine, Ser: 1.29 mg/dL (ref 0.40–1.50)
GFR: 69.86 mL/min (ref >60.00)
Glucose, Bld: 91 mg/dL (ref 70–99)
Potassium: 3.6 mEq/L (ref 3.5–5.1)
Sodium: 139 mEq/L (ref 135–145)

## 2021-10-28 LAB — IBC + FERRITIN
Ferritin: 113.9 ng/mL (ref 22.0–322.0)
Iron: 76 ug/dL (ref 42–165)
Saturation Ratios: 22.2 % (ref 20.0–50.0)
TIBC: 341.6 ug/dL (ref 250.0–450.0)
Transferrin: 244 mg/dL (ref 212.0–360.0)

## 2021-10-28 LAB — LIPID PANEL
Cholesterol: 143 mg/dL (ref 0–200)
HDL: 38.6 mg/dL — ABNORMAL LOW (ref >39.00)
NonHDL: 104.09
Total CHOL/HDL Ratio: 4
Triglycerides: 228 mg/dL — ABNORMAL HIGH (ref 0.0–149.0)
VLDL: 45.6 mg/dL — ABNORMAL HIGH (ref 0.0–40.0)

## 2021-10-28 LAB — LDL CHOLESTEROL, DIRECT: Direct LDL: 81 mg/dL

## 2021-10-28 LAB — FOLATE: Folate: 4.8 ng/mL — ABNORMAL LOW (ref >5.9)

## 2021-10-28 LAB — VITAMIN B12: Vitamin B-12: 267 pg/mL (ref 211–911)

## 2021-10-28 MED ORDER — FOLIC ACID 1 MG PO TABS: 1.0000 mg | ORAL_TABLET | Freq: Every day | ORAL | 1 refills | Status: DC

## 2021-10-28 MED ORDER — CYANOCOBALAMIN 2000 MCG PO TABS: 2000.0000 ug | ORAL_TABLET | Freq: Every day | ORAL | 1 refills | Status: AC

## 2021-10-30 LAB — ZINC: Zinc: 66 ug/dL (ref 60–130)

## 2021-11-01 LAB — VITAMIN B1: Vitamin B1 (Thiamine): 9 nmol/L (ref 8–30)

## 2021-11-25 ENCOUNTER — Other Ambulatory Visit: Payer: Self-pay | Admitting: Internal Medicine

## 2021-11-25 ENCOUNTER — Encounter: Payer: Self-pay | Admitting: Internal Medicine

## 2021-11-25 DIAGNOSIS — F900 Attention-deficit hyperactivity disorder, predominantly inattentive type: Secondary | ICD-10-CM

## 2021-11-25 MED ORDER — AMPHETAMINE-DEXTROAMPHETAMINE 10 MG PO TABS: 10.0000 mg | ORAL_TABLET | Freq: Every day | ORAL | 0 refills | Status: DC

## 2021-11-25 MED ORDER — AMPHETAMINE-DEXTROAMPHETAMINE 20 MG PO TABS: 20.0000 mg | ORAL_TABLET | Freq: Every day | ORAL | 0 refills | Status: DC

## 2021-12-27 ENCOUNTER — Other Ambulatory Visit: Payer: Self-pay | Admitting: Internal Medicine

## 2021-12-27 DIAGNOSIS — I10 Essential (primary) hypertension: Secondary | ICD-10-CM

## 2021-12-27 DIAGNOSIS — E781 Pure hyperglyceridemia: Secondary | ICD-10-CM

## 2021-12-27 DIAGNOSIS — E785 Hyperlipidemia, unspecified: Secondary | ICD-10-CM

## 2021-12-29 ENCOUNTER — Other Ambulatory Visit: Payer: Self-pay | Admitting: Internal Medicine

## 2021-12-29 DIAGNOSIS — F900 Attention-deficit hyperactivity disorder, predominantly inattentive type: Secondary | ICD-10-CM

## 2021-12-30 MED ORDER — AMPHETAMINE-DEXTROAMPHETAMINE 20 MG PO TABS: 20.0000 mg | ORAL_TABLET | Freq: Every day | ORAL | 0 refills | Status: DC

## 2021-12-30 MED ORDER — AMPHETAMINE-DEXTROAMPHETAMINE 10 MG PO TABS: 10.0000 mg | ORAL_TABLET | Freq: Every day | ORAL | 0 refills | Status: DC

## 2022-01-29 ENCOUNTER — Encounter: Payer: Self-pay | Admitting: Internal Medicine

## 2022-01-29 ENCOUNTER — Other Ambulatory Visit: Payer: Self-pay | Admitting: Internal Medicine

## 2022-01-29 DIAGNOSIS — F900 Attention-deficit hyperactivity disorder, predominantly inattentive type: Secondary | ICD-10-CM

## 2022-01-29 MED ORDER — AMPHETAMINE-DEXTROAMPHETAMINE 10 MG PO TABS: 10.0000 mg | ORAL_TABLET | Freq: Every day | ORAL | 0 refills | Status: DC

## 2022-01-29 MED ORDER — AMPHETAMINE-DEXTROAMPHETAMINE 20 MG PO TABS: 20.0000 mg | ORAL_TABLET | Freq: Every day | ORAL | 0 refills | Status: DC

## 2022-03-03 ENCOUNTER — Other Ambulatory Visit: Payer: Self-pay | Admitting: Internal Medicine

## 2022-03-03 DIAGNOSIS — F900 Attention-deficit hyperactivity disorder, predominantly inattentive type: Secondary | ICD-10-CM

## 2022-03-03 MED ORDER — AMPHETAMINE-DEXTROAMPHETAMINE 20 MG PO TABS: 20.0000 mg | ORAL_TABLET | Freq: Every day | ORAL | 0 refills | Status: DC

## 2022-03-03 MED ORDER — AMPHETAMINE-DEXTROAMPHETAMINE 10 MG PO TABS: 10.0000 mg | ORAL_TABLET | Freq: Every day | ORAL | 0 refills | Status: DC

## 2022-03-27 ENCOUNTER — Other Ambulatory Visit: Payer: Self-pay | Admitting: Internal Medicine

## 2022-03-27 DIAGNOSIS — I10 Essential (primary) hypertension: Secondary | ICD-10-CM

## 2022-03-27 DIAGNOSIS — E785 Hyperlipidemia, unspecified: Secondary | ICD-10-CM

## 2022-03-28 ENCOUNTER — Encounter: Payer: Self-pay | Admitting: Internal Medicine

## 2022-04-01 ENCOUNTER — Other Ambulatory Visit: Payer: Self-pay | Admitting: Internal Medicine

## 2022-04-01 DIAGNOSIS — I10 Essential (primary) hypertension: Secondary | ICD-10-CM

## 2022-04-01 DIAGNOSIS — E781 Pure hyperglyceridemia: Secondary | ICD-10-CM

## 2022-04-01 DIAGNOSIS — E785 Hyperlipidemia, unspecified: Secondary | ICD-10-CM

## 2022-04-01 MED ORDER — ROSUVASTATIN CALCIUM 20 MG PO TABS: 20.0000 mg | ORAL_TABLET | Freq: Every day | ORAL | 0 refills | Status: DC

## 2022-04-01 MED ORDER — IRBESARTAN 150 MG PO TABS: 150.0000 mg | ORAL_TABLET | Freq: Every day | ORAL | 0 refills | Status: DC

## 2022-04-01 MED ORDER — OMEGA-3-ACID ETHYL ESTERS 1 G PO CAPS: 2.0000 | ORAL_CAPSULE | Freq: Two times a day (BID) | ORAL | 1 refills | Status: DC

## 2022-04-04 ENCOUNTER — Other Ambulatory Visit: Payer: Self-pay | Admitting: Internal Medicine

## 2022-04-04 DIAGNOSIS — F900 Attention-deficit hyperactivity disorder, predominantly inattentive type: Secondary | ICD-10-CM

## 2022-04-04 MED ORDER — AMPHETAMINE-DEXTROAMPHETAMINE 20 MG PO TABS: 20.0000 mg | ORAL_TABLET | Freq: Every day | ORAL | 0 refills | Status: DC

## 2022-04-04 MED ORDER — AMPHETAMINE-DEXTROAMPHETAMINE 10 MG PO TABS: 10.0000 mg | ORAL_TABLET | Freq: Every day | ORAL | 0 refills | Status: DC

## 2022-04-27 ENCOUNTER — Other Ambulatory Visit: Payer: Self-pay | Admitting: Internal Medicine

## 2022-04-27 DIAGNOSIS — D528 Other folate deficiency anemias: Secondary | ICD-10-CM

## 2022-05-06 ENCOUNTER — Other Ambulatory Visit: Payer: Self-pay | Admitting: Internal Medicine

## 2022-05-06 DIAGNOSIS — F900 Attention-deficit hyperactivity disorder, predominantly inattentive type: Secondary | ICD-10-CM

## 2022-05-06 MED ORDER — AMPHETAMINE-DEXTROAMPHETAMINE 20 MG PO TABS
20.0000 mg | ORAL_TABLET | Freq: Every day | ORAL | 0 refills | Status: DC
Start: 2022-05-06 — End: 2022-06-03

## 2022-05-06 MED ORDER — AMPHETAMINE-DEXTROAMPHETAMINE 10 MG PO TABS
10.0000 mg | ORAL_TABLET | Freq: Every day | ORAL | 0 refills | Status: DC
Start: 2022-05-06 — End: 2022-06-03

## 2022-05-28 ENCOUNTER — Ambulatory Visit (INDEPENDENT_AMBULATORY_CARE_PROVIDER_SITE_OTHER): Payer: 59 | Admitting: Internal Medicine

## 2022-05-28 ENCOUNTER — Encounter: Payer: Self-pay | Admitting: Internal Medicine

## 2022-05-28 VITALS — BP 118/84 | HR 85 | Temp 97.9°F | Resp 16 | Ht 69.0 in | Wt 213.0 lb

## 2022-05-28 DIAGNOSIS — D528 Other folate deficiency anemias: Secondary | ICD-10-CM | POA: Diagnosis not present

## 2022-05-28 DIAGNOSIS — E781 Pure hyperglyceridemia: Secondary | ICD-10-CM | POA: Diagnosis not present

## 2022-05-28 DIAGNOSIS — E785 Hyperlipidemia, unspecified: Secondary | ICD-10-CM | POA: Diagnosis not present

## 2022-05-28 DIAGNOSIS — D518 Other vitamin B12 deficiency anemias: Secondary | ICD-10-CM | POA: Diagnosis not present

## 2022-05-28 DIAGNOSIS — I10 Essential (primary) hypertension: Secondary | ICD-10-CM | POA: Diagnosis not present

## 2022-05-28 LAB — CBC WITH DIFFERENTIAL/PLATELET
Basophils Absolute: 0 10*3/uL (ref 0.0–0.1)
Basophils Relative: 0.5 % (ref 0.0–3.0)
Eosinophils Absolute: 0 10*3/uL (ref 0.0–0.7)
Eosinophils Relative: 0.5 % (ref 0.0–5.0)
HCT: 42.3 % (ref 39.0–52.0)
Hemoglobin: 14.6 g/dL (ref 13.0–17.0)
Lymphocytes Relative: 26.9 % (ref 12.0–46.0)
Lymphs Abs: 2 10*3/uL (ref 0.7–4.0)
MCHC: 34.4 g/dL (ref 30.0–36.0)
MCV: 87.5 fl (ref 78.0–100.0)
Monocytes Absolute: 0.4 10*3/uL (ref 0.1–1.0)
Monocytes Relative: 5.6 % (ref 3.0–12.0)
Neutro Abs: 5.1 10*3/uL (ref 1.4–7.7)
Neutrophils Relative %: 66.5 % (ref 43.0–77.0)
Platelets: 298 10*3/uL (ref 150.0–400.0)
RBC: 4.84 Mil/uL (ref 4.22–5.81)
RDW: 13.7 % (ref 11.5–15.5)
WBC: 7.6 10*3/uL (ref 4.0–10.5)

## 2022-05-28 LAB — BASIC METABOLIC PANEL
BUN: 15 mg/dL (ref 6–23)
CO2: 28 mEq/L (ref 19–32)
Calcium: 10 mg/dL (ref 8.4–10.5)
Chloride: 99 mEq/L (ref 96–112)
Creatinine, Ser: 1.16 mg/dL (ref 0.40–1.50)
GFR: 79.03 mL/min (ref >60.00)
Glucose, Bld: 84 mg/dL (ref 70–99)
Potassium: 3.8 mEq/L (ref 3.5–5.1)
Sodium: 137 mEq/L (ref 135–145)

## 2022-05-28 LAB — VITAMIN B12: Vitamin B-12: 304 pg/mL (ref 211–911)

## 2022-05-28 LAB — TSH: TSH: 1.33 u[IU]/mL (ref 0.35–5.50)

## 2022-05-28 LAB — TRIGLYCERIDES: Triglycerides: 129 mg/dL (ref 0.0–149.0)

## 2022-05-28 NOTE — Progress Notes (Signed)
Subjective:  Patient ID: Kenneth Haney, male    DOB: 12/18/1980  Age: 42 y.o. MRN: [id number]  CC: Hypertension and Hyperlipidemia   HPI [facility] presents for f/up -     He is active and denies chest pain, shortness of breath, diaphoresis, dizziness, lightheadedness, or edema.  Outpatient Medications Prior to Visit  Medication Sig Dispense Refill   amphetamine-dextroamphetamine (ADDERALL) 10 MG tablet Take 1 tablet (10 mg total) by mouth daily with breakfast. 30 tablet 0   amphetamine-dextroamphetamine (ADDERALL) 20 MG tablet Take 1 tablet (20 mg total) by mouth daily. 30 tablet 0   cyanocobalamin 2000 MCG tablet Take 1 tablet (2,000 mcg total) by mouth daily. 90 tablet 1   Desvenlafaxine Succinate (PRISTIQ PO) Take by mouth. Pt is not sure of the dose     folic acid (FOLVITE) 1 MG tablet TAKE 1 TABLET BY MOUTH DAILY 90 tablet 1   omega-3 acid ethyl esters (LOVAZA) 1 g capsule Take 2 capsules (2 g total) by mouth 2 (two) times daily. 360 capsule 1   rosuvastatin (CRESTOR) 20 MG tablet Take 1 tablet (20 mg total) by mouth daily. 90 tablet 0   irbesartan (AVAPRO) 150 MG tablet Take 1 tablet (150 mg total) by mouth daily. 90 tablet 0   No facility-administered medications prior to visit.    ROS Review of Systems  Constitutional: Negative.  Negative for diaphoresis and fatigue.  HENT: Negative.    Eyes: Negative.   Respiratory:  Negative for cough, chest tightness, shortness of breath and wheezing.   Cardiovascular:  Negative for chest pain, palpitations and leg swelling.  Gastrointestinal:  Negative for abdominal pain, constipation, diarrhea, nausea and vomiting.  Endocrine: Negative.   Genitourinary: Negative.  Negative for difficulty urinating.  Musculoskeletal: Negative.  Negative for arthralgias and myalgias.  Skin: Negative.   Neurological:  Negative for dizziness and weakness.  Hematological:  Negative for adenopathy. Does not bruise/bleed easily.   Psychiatric/Behavioral: Negative.      Objective:  BP 118/84 (BP Location: Left Arm, Patient Position: Sitting, Cuff Size: Large)   Pulse 85   Temp 97.9 F (36.6 C) (Oral)   Resp 16   Ht 5\' 9"  (1.753 m)   Wt 213 lb (96.6 kg)   SpO2 96%   BMI 31.45 kg/m   BP Readings from Last 3 Encounters:  05/28/22 118/84  10/22/21 128/78  12/24/20 124/86    Wt Readings from Last 3 Encounters:  05/28/22 213 lb (96.6 kg)  10/22/21 222 lb (100.7 kg)  12/24/20 216 lb 9.6 oz (98.2 kg)    Physical Exam Vitals reviewed.  Constitutional:      Appearance: He is not ill-appearing.  HENT:     Nose: Nose normal.     Mouth/Throat:     Mouth: Mucous membranes are moist.  Eyes:     General: No scleral icterus.    Conjunctiva/sclera: Conjunctivae normal.  Cardiovascular:     Rate and Rhythm: Normal rate and regular rhythm.     Heart sounds: No murmur heard. Pulmonary:     Effort: Pulmonary effort is normal.     Breath sounds: No stridor. No wheezing, rhonchi or rales.  Abdominal:     General: Abdomen is flat.     Palpations: There is no mass.     Tenderness: There is no abdominal tenderness. There is no guarding.     Hernia: No hernia is present.  Musculoskeletal:        General: Normal range  of motion.     Cervical back: Neck supple.     Right lower leg: No edema.     Left lower leg: No edema.  Lymphadenopathy:     Cervical: No cervical adenopathy.  Skin:    General: Skin is warm and dry.  Neurological:     General: No focal deficit present.     Mental Status: He is alert. Mental status is at baseline.  Psychiatric:        Mood and Affect: Mood normal.        Behavior: Behavior normal.     Lab Results  Component Value Date   WBC 7.6 05/28/2022   HGB 14.6 05/28/2022   HCT 42.3 05/28/2022   PLT 298.0 05/28/2022   GLUCOSE 84 05/28/2022   CHOL 143 10/28/2021   TRIG 129.0 05/28/2022   HDL 38.60 (L) 10/28/2021   LDLDIRECT 81.0 10/28/2021   LDLCALC 62 05/17/2020   ALT 45  05/17/2020   AST 24 05/17/2020   NA 137 05/28/2022   K 3.8 05/28/2022   CL 99 05/28/2022   CREATININE 1.16 05/28/2022   BUN 15 05/28/2022   CO2 28 05/28/2022   TSH 1.33 05/28/2022    DG Abd Acute W/Chest  Result Date: 06/26/2016 CLINICAL DATA:  Left lower quadrant pain for 2 days.  Constipation. EXAM: DG ABDOMEN ACUTE W/ 1V CHEST COMPARISON:  None. FINDINGS: Single-view of the chest demonstrates clear lungs and normal heart size. No pneumothorax or pleural effusion. No bony abnormality. Two views of the abdomen show no free intraperitoneal air. The bowel gas pattern is nonobstructive. Moderately large stool burden noted. IMPRESSION: No acute abnormality. Moderately large stool burden. Electronically Signed   By: Drusilla Kanner M.D.   On: 06/26/2016 10:31    Assessment & Plan:   Other folate deficiency anemias- His H&H are normal now. -     CBC with Differential/Platelet; Future  Vitamin B12 deficiency (dietary) anemia -     CBC with Differential/Platelet; Future -     Vitamin B12; Future  Pure hyperglyceridemia -     Triglycerides; Future  Hyperlipidemia with target LDL less than 130- LDL goal achieved. Doing well on the statin  -     TSH; Future  Essential hypertension, benign- His blood pressure is overcontrolled.  Will discontinue the the ARB. -     Basic metabolic panel; Future -     TSH; Future     Follow-up: No follow-ups on file.  Sanda Linger, MD

## 2022-06-03 ENCOUNTER — Other Ambulatory Visit: Payer: Self-pay | Admitting: Internal Medicine

## 2022-06-03 DIAGNOSIS — F900 Attention-deficit hyperactivity disorder, predominantly inattentive type: Secondary | ICD-10-CM

## 2022-06-03 MED ORDER — AMPHETAMINE-DEXTROAMPHETAMINE 20 MG PO TABS
20.0000 mg | ORAL_TABLET | Freq: Every day | ORAL | 0 refills | Status: DC
Start: 2022-06-03 — End: 2022-07-03

## 2022-06-03 MED ORDER — AMPHETAMINE-DEXTROAMPHETAMINE 10 MG PO TABS
10.0000 mg | ORAL_TABLET | Freq: Every day | ORAL | 0 refills | Status: DC
Start: 2022-06-03 — End: 2022-07-03

## 2022-07-03 ENCOUNTER — Other Ambulatory Visit: Payer: Self-pay | Admitting: Internal Medicine

## 2022-07-03 DIAGNOSIS — F900 Attention-deficit hyperactivity disorder, predominantly inattentive type: Secondary | ICD-10-CM

## 2022-07-03 MED ORDER — AMPHETAMINE-DEXTROAMPHETAMINE 20 MG PO TABS
20.0000 mg | ORAL_TABLET | Freq: Every day | ORAL | 0 refills | Status: DC
Start: 2022-07-03 — End: 2022-07-31

## 2022-07-03 MED ORDER — AMPHETAMINE-DEXTROAMPHETAMINE 10 MG PO TABS
10.0000 mg | ORAL_TABLET | Freq: Every day | ORAL | 0 refills | Status: DC
Start: 2022-07-03 — End: 2022-07-31

## 2022-07-05 ENCOUNTER — Other Ambulatory Visit: Payer: Self-pay | Admitting: Internal Medicine

## 2022-07-05 DIAGNOSIS — E785 Hyperlipidemia, unspecified: Secondary | ICD-10-CM

## 2022-07-05 DIAGNOSIS — I10 Essential (primary) hypertension: Secondary | ICD-10-CM

## 2022-07-05 MED ORDER — ROSUVASTATIN CALCIUM 20 MG PO TABS
20.0000 mg | ORAL_TABLET | Freq: Every day | ORAL | 0 refills | Status: DC
Start: 2022-07-05 — End: 2022-10-06

## 2022-07-07 ENCOUNTER — Other Ambulatory Visit: Payer: Self-pay | Admitting: Internal Medicine

## 2022-07-07 DIAGNOSIS — I10 Essential (primary) hypertension: Secondary | ICD-10-CM

## 2022-07-28 ENCOUNTER — Encounter: Payer: Self-pay | Admitting: Internal Medicine

## 2022-07-28 ENCOUNTER — Other Ambulatory Visit: Payer: Self-pay | Admitting: Internal Medicine

## 2022-07-28 ENCOUNTER — Telehealth: Payer: Self-pay | Admitting: Internal Medicine

## 2022-07-28 DIAGNOSIS — U071 COVID-19: Secondary | ICD-10-CM | POA: Insufficient documentation

## 2022-07-28 MED ORDER — NIRMATRELVIR/RITONAVIR (PAXLOVID)TABLET
3.0000 | ORAL_TABLET | Freq: Two times a day (BID) | ORAL | 0 refills | Status: AC
Start: 2022-07-28 — End: 2022-08-02

## 2022-07-28 MED ORDER — NIRMATRELVIR/RITONAVIR (PAXLOVID)TABLET
3.0000 | ORAL_TABLET | Freq: Two times a day (BID) | ORAL | 0 refills | Status: DC
Start: 2022-07-28 — End: 2022-07-28

## 2022-07-28 NOTE — Telephone Encounter (Signed)
Patient's pharmacy called to follow up. The dosage instructions are unclear if the patient needs the regular dosage or the renally impaired dosage. They will need to order it if it is renally impaired. Best callback for [facility] is [phone].

## 2022-07-28 NOTE — Telephone Encounter (Signed)
[id number] - [place], [place] - [address] Phone: [phone]  Fax: [phone]     They called inbqurinbg aout he dosage about nirmatrelvir/ritonavir (PAXLOVID) 20 x 150 MG & 10 x 100MG  TABS. Please advise

## 2022-07-31 ENCOUNTER — Other Ambulatory Visit: Payer: Self-pay | Admitting: Internal Medicine

## 2022-07-31 DIAGNOSIS — F900 Attention-deficit hyperactivity disorder, predominantly inattentive type: Secondary | ICD-10-CM

## 2022-07-31 MED ORDER — AMPHETAMINE-DEXTROAMPHETAMINE 20 MG PO TABS: 20.0000 mg | ORAL_TABLET | Freq: Every day | ORAL | 0 refills | Status: DC

## 2022-07-31 MED ORDER — AMPHETAMINE-DEXTROAMPHETAMINE 10 MG PO TABS
10.0000 mg | ORAL_TABLET | Freq: Every day | ORAL | 0 refills | Status: DC
Start: 2022-07-31 — End: 2022-08-29

## 2022-08-08 ENCOUNTER — Encounter: Payer: Self-pay | Admitting: Internal Medicine

## 2022-08-29 ENCOUNTER — Other Ambulatory Visit: Payer: Self-pay | Admitting: Internal Medicine

## 2022-08-29 DIAGNOSIS — F900 Attention-deficit hyperactivity disorder, predominantly inattentive type: Secondary | ICD-10-CM

## 2022-08-29 MED ORDER — AMPHETAMINE-DEXTROAMPHETAMINE 10 MG PO TABS
10.0000 mg | ORAL_TABLET | Freq: Every day | ORAL | 0 refills | Status: DC
Start: 2022-08-29 — End: 2022-09-30

## 2022-08-29 MED ORDER — AMPHETAMINE-DEXTROAMPHETAMINE 20 MG PO TABS
20.0000 mg | ORAL_TABLET | Freq: Every day | ORAL | 0 refills | Status: DC
Start: 2022-08-29 — End: 2022-09-30

## 2022-09-29 ENCOUNTER — Encounter: Payer: Self-pay | Admitting: Internal Medicine

## 2022-09-29 ENCOUNTER — Other Ambulatory Visit: Payer: Self-pay | Admitting: Internal Medicine

## 2022-09-29 DIAGNOSIS — F900 Attention-deficit hyperactivity disorder, predominantly inattentive type: Secondary | ICD-10-CM

## 2022-09-30 ENCOUNTER — Other Ambulatory Visit: Payer: Self-pay | Admitting: Emergency Medicine

## 2022-09-30 DIAGNOSIS — F900 Attention-deficit hyperactivity disorder, predominantly inattentive type: Secondary | ICD-10-CM

## 2022-09-30 MED ORDER — AMPHETAMINE-DEXTROAMPHETAMINE 20 MG PO TABS: 20.0000 mg | ORAL_TABLET | Freq: Every day | ORAL | 0 refills | Status: DC

## 2022-09-30 NOTE — Telephone Encounter (Signed)
New prescription for Adderall sent to pharmacy of record today.  Thanks.

## 2022-10-06 ENCOUNTER — Other Ambulatory Visit: Payer: Self-pay | Admitting: Internal Medicine

## 2022-10-06 DIAGNOSIS — E785 Hyperlipidemia, unspecified: Secondary | ICD-10-CM

## 2022-10-13 ENCOUNTER — Ambulatory Visit (INDEPENDENT_AMBULATORY_CARE_PROVIDER_SITE_OTHER): Payer: 59 | Admitting: Internal Medicine

## 2022-10-13 ENCOUNTER — Encounter: Payer: Self-pay | Admitting: Internal Medicine

## 2022-10-13 VITALS — BP 124/72 | HR 78 | Temp 97.9°F | Resp 16 | Ht 69.0 in | Wt 211.0 lb

## 2022-10-13 DIAGNOSIS — F900 Attention-deficit hyperactivity disorder, predominantly inattentive type: Secondary | ICD-10-CM | POA: Diagnosis not present

## 2022-10-13 DIAGNOSIS — I10 Essential (primary) hypertension: Secondary | ICD-10-CM

## 2022-10-13 DIAGNOSIS — Z23 Encounter for immunization: Secondary | ICD-10-CM

## 2022-10-13 MED ORDER — AMPHETAMINE-DEXTROAMPHETAMINE 20 MG PO TABS
20.0000 mg | ORAL_TABLET | Freq: Every day | ORAL | 0 refills | Status: DC
Start: 2022-10-13 — End: 2022-10-15

## 2022-10-13 NOTE — Progress Notes (Unsigned)
Subjective:  Patient ID: Kenneth Haney, male    DOB: 03-02-1980  Age: 42 y.o. MRN: [id number]  CC: Hypertension   HPI Kenneth Haney presents for f/up ----  Discussed the use of AI scribe software for clinical note transcription with the patient, who gave verbal consent to proceed.  History of Present Illness   The patient, with a known history of hypertension and hyperlipidemia, presents with concerns about their blood pressure. They were previously on antihypertensive medication, which was discontinued due to low readings. Despite having no symptoms of hypertension such as headache, blurred vision, chest pain, shortness of breath, or leg swelling, the patient was worried about their blood pressure due to a strong family history of hypertension.  The patient maintains an active lifestyle, including daily golfing, and reports no chest pain or shortness of breath. They are currently on cholesterol medication and Adderall, with no reported side effects such as muscle aches, joint aches, anxiety, nervousness, or anger management issues.       Outpatient Medications Prior to Visit  Medication Sig Dispense Refill   cyanocobalamin 2000 MCG tablet Take 1 tablet (2,000 mcg total) by mouth daily. 90 tablet 1   Desvenlafaxine Succinate (PRISTIQ PO) Take by mouth. Pt is not sure of the dose     folic acid (FOLVITE) 1 MG tablet TAKE 1 TABLET BY MOUTH DAILY 90 tablet 1   omega-3 acid ethyl esters (LOVAZA) 1 g capsule Take 2 capsules (2 g total) by mouth 2 (two) times daily. 360 capsule 1   rosuvastatin (CRESTOR) 20 MG tablet TAKE 1 TABLET BY MOUTH DAILY 90 tablet 0   amphetamine-dextroamphetamine (ADDERALL) 20 MG tablet Take 1 tablet (20 mg total) by mouth daily. 30 tablet 0   No facility-administered medications prior to visit.    ROS Review of Systems  Objective:  BP 124/72 (BP Location: Left Arm, Patient Position: Sitting, Cuff Size: Large)   Pulse 78   Ht 5\' 9"  (1.753 m)   Wt 211  lb (95.7 kg)   SpO2 97%   BMI 31.16 kg/m   BP Readings from Last 3 Encounters:  10/13/22 124/72  05/28/22 118/84  10/22/21 128/78    Wt Readings from Last 3 Encounters:  10/13/22 211 lb (95.7 kg)  05/28/22 213 lb (96.6 kg)  10/22/21 222 lb (100.7 kg)    Physical Exam  Lab Results  Component Value Date   WBC 7.6 05/28/2022   HGB 14.6 05/28/2022   HCT 42.3 05/28/2022   PLT 298.0 05/28/2022   GLUCOSE 84 05/28/2022   CHOL 143 10/28/2021   TRIG 129.0 05/28/2022   HDL 38.60 (L) 10/28/2021   LDLDIRECT 81.0 10/28/2021   LDLCALC 62 05/17/2020   ALT 45 05/17/2020   AST 24 05/17/2020   NA 137 05/28/2022   K 3.8 05/28/2022   CL 99 05/28/2022   CREATININE 1.16 05/28/2022   BUN 15 05/28/2022   CO2 28 05/28/2022   TSH 1.33 05/28/2022    DG Abd Acute W/Chest  Result Date: 06/26/2016 CLINICAL DATA:  Left lower quadrant pain for 2 days.  Constipation. EXAM: DG ABDOMEN ACUTE W/ 1V CHEST COMPARISON:  None. FINDINGS: Single-view of the chest demonstrates clear lungs and normal heart size. No pneumothorax or pleural effusion. No bony abnormality. Two views of the abdomen show no free intraperitoneal air. The bowel gas pattern is nonobstructive. Moderately large stool burden noted. IMPRESSION: No acute abnormality. Moderately large stool burden. Electronically Signed   By: Drusilla Kanner M.D.  On: 06/26/2016 10:31    Assessment & Plan:  Flu vaccine need -     Flu vaccine trivalent PF, 6mos and older(Flulaval,Afluria,Fluarix,Fluzone)  Attention deficit hyperactivity disorder (ADHD), predominantly inattentive type -     Amphetamine-Dextroamphetamine; Take 1 tablet (20 mg total) by mouth daily.  Dispense: 30 tablet; Refill: 0 -     CBC with Differential/Platelet; Future  Essential hypertension, benign -     Basic metabolic panel; Future -     CBC with Differential/Platelet; Future     Follow-up: No follow-ups on file.  Sanda Linger, MD

## 2022-10-13 NOTE — Patient Instructions (Signed)
Amphetamine; Dextroamphetamine Tablets What is this medication? AMPHETAMINE; DEXTROAMPHETAMINE (am FET a meen; dex troe am FET a meen) treats attention-deficit hyperactivity disorder (ADHD). It works by improving focus and reducing impulsive behavior. It may also be used to treat narcolepsy. It works by promoting wakefulness. It belongs to a group of medications called stimulants. This medicine may be used for other purposes; ask your health care provider or pharmacist if you have questions. COMMON BRAND NAME(S): Adderall What should I tell my care team before I take this medication? They need to know if you have any of these conditions: Anxiety or panic attacks Circulation problems in fingers or toes (Raynaud syndrome) Glaucoma Heart attack Heart disease High blood pressure Kidney disease Liver disease Mental health conditions Seizures Stroke Substance use disorder Suicidal thoughts, plans, or attempt by you or a family member Thyroid disease Tourette syndrome An unusual or allergic reaction to dextroamphetamine, other medications, foods, dyes, or preservatives Pregnant or trying to get pregnant Breastfeeding How should I use this medication? Take this medication by mouth. Take it as directed on the prescription label at the same time every day. You can take it with or without food. If it upsets your stomach, take it with food. Keep taking it unless your care team tells you to stop. A special MedGuide will be given to you by the pharmacist with each prescription and refill. Be sure to read this information carefully each time. Talk to your care team about the use of this medication in children. While it may be prescribed for children as young as 3 years for selected conditions, precautions do apply. Overdosage: If you think you have taken too much of this medicine contact a poison control center or emergency room at once. NOTE: This medicine is only for you. Do not share this medicine  with others. What if I miss a dose? If you miss a dose, take it as soon as you can. If it is almost time for your next dose, take only that dose. Do not take double or extra doses. What may interact with this medication? Do not take this medication with any of the following: Linezolid MAOIs, such as Marplan, Nardil, and Parnate Methylene blue This medication may also interact with the following: Acetazolamide Alcohol Ascorbic acid Certain medications for depression, anxiety, or other mental health conditions Certain medications for migraines, such as sumatriptan Guanethidine Opioids Reserpine Sodium bicarbonate St. John's wort Thiazide diuretics, such as chlorothiazide Tryptophan This list may not describe all possible interactions. Give your health care provider a list of all the medicines, herbs, non-prescription drugs, or dietary supplements you use. Also tell them if you smoke, drink alcohol, or use illegal drugs. Some items may interact with your medicine. What should I watch for while using this medication? Visit your care team for regular checks on your progress. Tell your care team if your symptoms do not start to get better or if they get worse. This medication requires a new prescription from your care team every time it is filled at the pharmacy. This medication can be abused and cause your brain and body to depend on it after high doses or long term use. Your care team will assess your risk and monitor you closely during treatment. Long term use of this medication may cause your brain and body to depend on it. You may be able to take breaks from this medication during weekends, holidays, or summer vacations. Talk to your care team about what works for you. If  your care team wants you to stop this medication permanently, the dose may be slowly lowered over time to reduce the risk of side effects. Tell your care team if this medication loses its effects, or if you feel you need  to take more than the prescribed amount. Do not change your dose without talking to your care team. Do not take this medication close to bedtime. It may prevent you from sleeping. Loss of appetite is common when starting this medication. Eating small, frequent meals or snacks can help. Talk to your care team if appetite loss persists. Children should have height and weight checked often while taking this medication. Tell your care team right away if you notice unexplained wounds on your fingers and toes while taking this medication. You should also tell your care team if you experience numbness or pain, changes in the skin color, or sensitivity to temperature in your fingers or toes. Contact your care team right away if you have an erection that lasts longer than 4 hours or if it becomes painful. This may be a sign of a serious problem and must be treated right away to prevent permanent damage. What side effects may I notice from receiving this medication? Side effects that you should report to your care team as soon as possible: Allergic reactions--skin rash, itching, hives, swelling of the face, lips, tongue, or throat Heart attack--pain or tightness in the chest, shoulders, arms, or jaw, nausea, shortness of breath, cold or clammy skin, feeling faint or lightheaded Heart rhythm changes--fast or irregular heartbeat, dizziness, feeling faint or lightheaded, chest pain, trouble breathing Increase in blood pressure Irritability, confusion, fast or irregular heartbeat, muscle stiffness, twitching muscles, sweating, high fever, seizure, chills, vomiting, diarrhea, which may be signs of serotonin syndrome Mood and behavior changes--anxiety, nervousness, confusion, hallucinations, irritability, hostility, thoughts of suicide or self-harm, worsening mood, feelings of depression Prolonged or painful erection Raynaud syndrome--cool, numb, or painful fingers or toes that may change color from pale, to blue, to  red Seizures Stroke--sudden numbness or weakness of the face, arm, or leg, trouble speaking, confusion, trouble walking, loss of balance or coordination, dizziness, severe headache, change in vision Side effects that usually do not require medical attention (report these to your care team if they continue or are bothersome): Dry mouth Headache Loss of appetite with weight loss Nausea Stomach pain Trouble sleeping This list may not describe all possible side effects. Call your doctor for medical advice about side effects. You may report side effects to FDA at 1-800-FDA-1088. Where should I keep my medication? Keep out of the reach of children and pets. This medication can be abused. Keep it in a safe place to protect it from theft. Do not share it with anyone. It is only for you. Selling or giving away this medication is dangerous and against the law. Store at room temperature between 20 and 25 degrees C (68 and 77 degrees F). Protect from light and moisture. Keep container tightly closed. Get rid of any unused medication after the expiration date. This medication may cause harm and death if it is taken by other adults, children, or pets. It is important to get rid of the medication as soon as you no longer need it, or it is expired. You can do this in two ways: Take the medication to a medication take-back program. Check with your pharmacy or law enforcement to find a location. If you cannot return the medication, check the label or package insert to see  if the medication should be thrown out in the garbage or flushed down the toilet. If you are not sure, ask your care team. If it is safe to put it in the trash, take the medication out of the container. Mix the medication with cat litter, dirt, coffee grounds, or other unwanted substance. Seal the mixture in a bag or container. Put it in the trash. NOTE: This sheet is a summary. It may not cover all possible information. If you have questions about  this medicine, talk to your doctor, pharmacist, or health care provider.  2024 Elsevier/Gold Standard (2021-12-18 00:00:00)

## 2022-10-15 ENCOUNTER — Encounter: Payer: Self-pay | Admitting: Internal Medicine

## 2022-10-15 ENCOUNTER — Other Ambulatory Visit: Payer: Self-pay | Admitting: Internal Medicine

## 2022-10-15 DIAGNOSIS — F900 Attention-deficit hyperactivity disorder, predominantly inattentive type: Secondary | ICD-10-CM

## 2022-10-15 MED ORDER — AMPHETAMINE-DEXTROAMPHETAMINE 20 MG PO TABS
20.0000 mg | ORAL_TABLET | Freq: Every day | ORAL | 0 refills | Status: DC
Start: 2022-10-15 — End: 2022-10-23

## 2022-10-15 MED ORDER — AMPHETAMINE-DEXTROAMPHETAMINE 10 MG PO TABS
10.0000 mg | ORAL_TABLET | Freq: Every day | ORAL | 0 refills | Status: DC
Start: 2022-10-15 — End: 2022-11-13

## 2022-10-23 ENCOUNTER — Other Ambulatory Visit: Payer: Self-pay | Admitting: Internal Medicine

## 2022-10-23 DIAGNOSIS — F900 Attention-deficit hyperactivity disorder, predominantly inattentive type: Secondary | ICD-10-CM

## 2022-10-23 MED ORDER — AMPHETAMINE-DEXTROAMPHETAMINE 20 MG PO TABS
20.0000 mg | ORAL_TABLET | Freq: Every day | ORAL | 0 refills | Status: DC
Start: 2022-10-23 — End: 2022-11-25

## 2022-11-05 ENCOUNTER — Ambulatory Visit: Payer: 59 | Admitting: Internal Medicine

## 2022-11-07 ENCOUNTER — Other Ambulatory Visit: Payer: Self-pay | Admitting: Internal Medicine

## 2022-11-07 DIAGNOSIS — D528 Other folate deficiency anemias: Secondary | ICD-10-CM

## 2022-11-11 ENCOUNTER — Other Ambulatory Visit (INDEPENDENT_AMBULATORY_CARE_PROVIDER_SITE_OTHER): Payer: 59

## 2022-11-11 DIAGNOSIS — I10 Essential (primary) hypertension: Secondary | ICD-10-CM

## 2022-11-11 DIAGNOSIS — F900 Attention-deficit hyperactivity disorder, predominantly inattentive type: Secondary | ICD-10-CM | POA: Diagnosis not present

## 2022-11-11 LAB — CBC WITH DIFFERENTIAL/PLATELET
Basophils Absolute: 0.1 10*3/uL (ref 0.0–0.1)
Basophils Relative: 0.9 % (ref 0.0–3.0)
Eosinophils Absolute: 0 10*3/uL (ref 0.0–0.7)
Eosinophils Relative: 0.4 % (ref 0.0–5.0)
HCT: 41.5 % (ref 39.0–52.0)
Hemoglobin: 13.9 g/dL (ref 13.0–17.0)
Lymphocytes Relative: 34.6 % (ref 12.0–46.0)
Lymphs Abs: 2 10*3/uL (ref 0.7–4.0)
MCHC: 33.5 g/dL (ref 30.0–36.0)
MCV: 86.9 fL (ref 78.0–100.0)
Monocytes Absolute: 0.4 10*3/uL (ref 0.1–1.0)
Monocytes Relative: 6.6 % (ref 3.0–12.0)
Neutro Abs: 3.3 10*3/uL (ref 1.4–7.7)
Neutrophils Relative %: 57.5 % (ref 43.0–77.0)
Platelets: 270 10*3/uL (ref 150.0–400.0)
RBC: 4.77 Mil/uL (ref 4.22–5.81)
RDW: 14.5 % (ref 11.5–15.5)
WBC: 5.7 10*3/uL (ref 4.0–10.5)

## 2022-11-11 LAB — BASIC METABOLIC PANEL
BUN: 11 mg/dL (ref 6–23)
CO2: 23 meq/L (ref 19–32)
Calcium: 9.6 mg/dL (ref 8.4–10.5)
Chloride: 103 meq/L (ref 96–112)
Creatinine, Ser: 1.14 mg/dL (ref 0.40–1.50)
GFR: 80.44 mL/min (ref >60.00)
Glucose, Bld: 95 mg/dL (ref 70–99)
Potassium: 3.9 meq/L (ref 3.5–5.1)
Sodium: 137 meq/L (ref 135–145)

## 2022-11-13 ENCOUNTER — Other Ambulatory Visit: Payer: Self-pay | Admitting: Internal Medicine

## 2022-11-13 DIAGNOSIS — F900 Attention-deficit hyperactivity disorder, predominantly inattentive type: Secondary | ICD-10-CM

## 2022-11-13 MED ORDER — AMPHETAMINE-DEXTROAMPHETAMINE 10 MG PO TABS: 10.0000 mg | ORAL_TABLET | Freq: Every day | ORAL | 0 refills | Status: DC

## 2022-11-25 ENCOUNTER — Other Ambulatory Visit: Payer: Self-pay | Admitting: Internal Medicine

## 2022-11-25 DIAGNOSIS — F900 Attention-deficit hyperactivity disorder, predominantly inattentive type: Secondary | ICD-10-CM

## 2022-11-25 MED ORDER — AMPHETAMINE-DEXTROAMPHETAMINE 20 MG PO TABS
20.0000 mg | ORAL_TABLET | Freq: Every day | ORAL | 0 refills | Status: DC
Start: 2022-11-25 — End: 2023-02-27

## 2022-12-15 ENCOUNTER — Other Ambulatory Visit: Payer: Self-pay | Admitting: Internal Medicine

## 2022-12-15 DIAGNOSIS — F900 Attention-deficit hyperactivity disorder, predominantly inattentive type: Secondary | ICD-10-CM

## 2022-12-15 MED ORDER — AMPHETAMINE-DEXTROAMPHETAMINE 10 MG PO TABS: 10.0000 mg | ORAL_TABLET | Freq: Every day | ORAL | 0 refills | Status: DC

## 2023-01-13 ENCOUNTER — Other Ambulatory Visit: Payer: Self-pay | Admitting: Internal Medicine

## 2023-01-13 DIAGNOSIS — F900 Attention-deficit hyperactivity disorder, predominantly inattentive type: Secondary | ICD-10-CM

## 2023-01-13 MED ORDER — AMPHETAMINE-DEXTROAMPHETAMINE 10 MG PO TABS: 10.0000 mg | ORAL_TABLET | Freq: Every day | ORAL | 0 refills | Status: DC

## 2023-02-13 ENCOUNTER — Other Ambulatory Visit: Payer: Self-pay | Admitting: Internal Medicine

## 2023-02-13 DIAGNOSIS — F900 Attention-deficit hyperactivity disorder, predominantly inattentive type: Secondary | ICD-10-CM

## 2023-02-16 ENCOUNTER — Encounter: Payer: Self-pay | Admitting: Internal Medicine

## 2023-02-16 MED ORDER — AMPHETAMINE-DEXTROAMPHETAMINE 10 MG PO TABS: 10.0000 mg | ORAL_TABLET | Freq: Every day | ORAL | 0 refills | Status: DC

## 2023-02-27 ENCOUNTER — Other Ambulatory Visit: Payer: Self-pay | Admitting: Internal Medicine

## 2023-02-27 DIAGNOSIS — F900 Attention-deficit hyperactivity disorder, predominantly inattentive type: Secondary | ICD-10-CM

## 2023-02-27 MED ORDER — AMPHETAMINE-DEXTROAMPHETAMINE 20 MG PO TABS: 20.0000 mg | ORAL_TABLET | Freq: Every day | ORAL | 0 refills | Status: DC

## 2023-03-17 ENCOUNTER — Other Ambulatory Visit: Payer: Self-pay | Admitting: Internal Medicine

## 2023-03-17 DIAGNOSIS — F900 Attention-deficit hyperactivity disorder, predominantly inattentive type: Secondary | ICD-10-CM

## 2023-03-19 ENCOUNTER — Encounter: Payer: Self-pay | Admitting: Internal Medicine

## 2023-03-19 MED ORDER — AMPHETAMINE-DEXTROAMPHETAMINE 10 MG PO TABS: 10.0000 mg | ORAL_TABLET | Freq: Every day | ORAL | 0 refills | Status: DC

## 2023-03-22 ENCOUNTER — Other Ambulatory Visit: Payer: Self-pay | Admitting: Internal Medicine

## 2023-03-22 DIAGNOSIS — E785 Hyperlipidemia, unspecified: Secondary | ICD-10-CM

## 2023-03-26 ENCOUNTER — Telehealth: Payer: Self-pay | Admitting: Radiology

## 2023-03-26 NOTE — Telephone Encounter (Signed)
 Copied from CRM [id number]. Topic: Clinical - Prescription Issue >> Mar 26, 2023  9:23 AM Gurney Maxin H wrote: Reason for CRM: [facility] calling to follow up on refill request for patients rosuvastatin (CRESTOR) 20 MG tablet, advised caller request pending provider signature.

## 2023-03-30 ENCOUNTER — Other Ambulatory Visit: Payer: Self-pay | Admitting: Internal Medicine

## 2023-03-30 DIAGNOSIS — F900 Attention-deficit hyperactivity disorder, predominantly inattentive type: Secondary | ICD-10-CM

## 2023-03-30 NOTE — Telephone Encounter (Signed)
 Medication was refilled. 03/26/2023

## 2023-04-01 MED ORDER — AMPHETAMINE-DEXTROAMPHETAMINE 20 MG PO TABS: 20.0000 mg | ORAL_TABLET | Freq: Every day | ORAL | 0 refills | Status: DC

## 2023-04-13 ENCOUNTER — Other Ambulatory Visit: Payer: Self-pay | Admitting: Internal Medicine

## 2023-04-13 DIAGNOSIS — F900 Attention-deficit hyperactivity disorder, predominantly inattentive type: Secondary | ICD-10-CM

## 2023-04-14 MED ORDER — AMPHETAMINE-DEXTROAMPHETAMINE 10 MG PO TABS: 10.0000 mg | ORAL_TABLET | Freq: Every day | ORAL | 0 refills | Status: DC

## 2023-04-21 ENCOUNTER — Ambulatory Visit (INDEPENDENT_AMBULATORY_CARE_PROVIDER_SITE_OTHER): Admitting: Internal Medicine

## 2023-04-21 ENCOUNTER — Encounter: Payer: Self-pay | Admitting: Internal Medicine

## 2023-04-21 ENCOUNTER — Ambulatory Visit: Attending: Internal Medicine

## 2023-04-21 VITALS — BP 142/94 | HR 84 | Temp 98.3°F | Resp 16 | Ht 69.0 in | Wt 214.6 lb

## 2023-04-21 DIAGNOSIS — D518 Other vitamin B12 deficiency anemias: Secondary | ICD-10-CM

## 2023-04-21 DIAGNOSIS — K2100 Gastro-esophageal reflux disease with esophagitis, without bleeding: Secondary | ICD-10-CM

## 2023-04-21 DIAGNOSIS — Z0000 Encounter for general adult medical examination without abnormal findings: Secondary | ICD-10-CM

## 2023-04-21 DIAGNOSIS — Z6832 Body mass index (BMI) 32.0-32.9, adult: Secondary | ICD-10-CM | POA: Insufficient documentation

## 2023-04-21 DIAGNOSIS — R002 Palpitations: Secondary | ICD-10-CM | POA: Diagnosis not present

## 2023-04-21 DIAGNOSIS — I10 Essential (primary) hypertension: Secondary | ICD-10-CM | POA: Insufficient documentation

## 2023-04-21 DIAGNOSIS — D528 Other folate deficiency anemias: Secondary | ICD-10-CM

## 2023-04-21 DIAGNOSIS — E781 Pure hyperglyceridemia: Secondary | ICD-10-CM | POA: Diagnosis not present

## 2023-04-21 DIAGNOSIS — E66811 Obesity, class 1: Secondary | ICD-10-CM

## 2023-04-21 DIAGNOSIS — Z0001 Encounter for general adult medical examination with abnormal findings: Secondary | ICD-10-CM

## 2023-04-21 DIAGNOSIS — F900 Attention-deficit hyperactivity disorder, predominantly inattentive type: Secondary | ICD-10-CM

## 2023-04-21 DIAGNOSIS — E6609 Other obesity due to excess calories: Secondary | ICD-10-CM

## 2023-04-21 LAB — URINALYSIS, ROUTINE W REFLEX MICROSCOPIC
Bilirubin Urine: NEGATIVE
Hgb urine dipstick: NEGATIVE
Ketones, ur: NEGATIVE
Leukocytes,Ua: NEGATIVE
Nitrite: NEGATIVE
Specific Gravity, Urine: 1.015 (ref 1.000–1.030)
Total Protein, Urine: NEGATIVE
Urine Glucose: NEGATIVE
Urobilinogen, UA: 0.2 (ref 0.0–1.0)
pH: 8 (ref 5.0–8.0)

## 2023-04-21 LAB — CBC WITH DIFFERENTIAL/PLATELET
Basophils Absolute: 0.1 10*3/uL (ref 0.0–0.1)
Basophils Relative: 1.1 % (ref 0.0–3.0)
Eosinophils Absolute: 0 10*3/uL (ref 0.0–0.7)
Eosinophils Relative: 0.5 % (ref 0.0–5.0)
HCT: 43.4 % (ref 39.0–52.0)
Hemoglobin: 14.8 g/dL (ref 13.0–17.0)
Lymphocytes Relative: 31 % (ref 12.0–46.0)
Lymphs Abs: 1.8 10*3/uL (ref 0.7–4.0)
MCHC: 34 g/dL (ref 30.0–36.0)
MCV: 87.8 fl (ref 78.0–100.0)
Monocytes Absolute: 0.3 10*3/uL (ref 0.1–1.0)
Monocytes Relative: 5.9 % (ref 3.0–12.0)
Neutro Abs: 3.5 10*3/uL (ref 1.4–7.7)
Neutrophils Relative %: 61.5 % (ref 43.0–77.0)
Platelets: 282 10*3/uL (ref 150.0–400.0)
RBC: 4.95 Mil/uL (ref 4.22–5.81)
RDW: 13.4 % (ref 11.5–15.5)
WBC: 5.7 10*3/uL (ref 4.0–10.5)

## 2023-04-21 LAB — HEPATIC FUNCTION PANEL
ALT: 46 U/L (ref 0–53)
AST: 27 U/L (ref 0–37)
Albumin: 4.9 g/dL (ref 3.5–5.2)
Alkaline Phosphatase: 57 U/L (ref 39–117)
Bilirubin, Direct: 0.1 mg/dL (ref 0.0–0.3)
Total Bilirubin: 0.5 mg/dL (ref 0.2–1.2)
Total Protein: 8 g/dL (ref 6.0–8.3)

## 2023-04-21 LAB — BASIC METABOLIC PANEL
BUN: 15 mg/dL (ref 6–23)
CO2: 27 meq/L (ref 19–32)
Calcium: 9.9 mg/dL (ref 8.4–10.5)
Chloride: 100 meq/L (ref 96–112)
Creatinine, Ser: 1.1 mg/dL (ref 0.40–1.50)
GFR: 83.7 mL/min (ref >60.00)
Glucose, Bld: 100 mg/dL — ABNORMAL HIGH (ref 70–99)
Potassium: 4.1 meq/L (ref 3.5–5.1)
Sodium: 138 meq/L (ref 135–145)

## 2023-04-21 LAB — LIPID PANEL
Cholesterol: 172 mg/dL (ref 0–200)
HDL: 49.5 mg/dL (ref >39.00)
LDL Cholesterol: 95 mg/dL (ref 0–99)
NonHDL: 122.57
Total CHOL/HDL Ratio: 3
Triglycerides: 136 mg/dL (ref 0.0–149.0)
VLDL: 27.2 mg/dL (ref 0.0–40.0)

## 2023-04-21 LAB — TSH: TSH: 1.6 u[IU]/mL (ref 0.35–5.50)

## 2023-04-21 LAB — VITAMIN B12: Vitamin B-12: 246 pg/mL (ref 211–911)

## 2023-04-21 MED ORDER — ZEPBOUND 2.5 MG/0.5ML ~~LOC~~ SOAJ: 2.5000 mg | SUBCUTANEOUS | 0 refills | Status: DC

## 2023-04-21 MED ORDER — AMPHETAMINE-DEXTROAMPHETAMINE 20 MG PO TABS: 30.0000 mg | ORAL_TABLET | Freq: Every day | ORAL | 0 refills | Status: DC

## 2023-04-21 MED ORDER — FOLIC ACID 1 MG PO TABS: 1.0000 mg | ORAL_TABLET | Freq: Every day | ORAL | 1 refills | Status: DC

## 2023-04-21 MED ORDER — INDAPAMIDE 1.25 MG PO TABS: 1.2500 mg | ORAL_TABLET | Freq: Every day | ORAL | 0 refills | Status: DC

## 2023-04-21 NOTE — Progress Notes (Unsigned)
 EP to read.

## 2023-04-21 NOTE — Progress Notes (Signed)
 Subjective:  Patient ID: Kenneth Haney, male    DOB: 1980-06-22  Age: 43 y.o. MRN: [id number]  CC: Annual Exam, Hypertension, and Hyperlipidemia   HPI Kenneth Haney presents for a CPX and f/up -----  Discussed the use of AI scribe software for clinical note transcription with the patient, who gave verbal consent to proceed.  History of Present Illness   Kenneth Haney is a 43 year old male who presents with concerns about elevated blood pressure and palpitations.  He experiences heart fluttering sensations at least once a day, sometimes as a single contraction and other times more prolonged, causing concern. He has a history of being treated for high blood pressure, which was discontinued last year after an episode of low blood pressure. He has ongoing anxiety about his blood pressure since discontinuing the medication. No chest pain or shortness of breath while playing golf.  He experiences dizziness, which he attributes to his health anxiety. He has ongoing anxiety about his heart health, which has shifted from a previous concern about diverticulitis. His sleep, weight, and appetite are generally okay, although he describes himself as 'not a great sleeper.'  He is on two anxiety medications, Pristiq and amitriptyline, which he believes make it difficult to lose weight. He has been trying to lose weight but finds it challenging.  He is currently taking Adderall and discusses issues with prescription refills, suggesting a preference for a 45-count of 20 mg tablets to manage his medication more effectively.  He mentions a recent incident where he cut himself with a knife last Tuesday and received a tetanus shot. He was given the option of glue or stitches and chose glue. He inquires about when he can resume playing golf, indicating concern about the healing process.       Outpatient Medications Prior to Visit  Medication Sig Dispense Refill   cyanocobalamin 2000 MCG tablet Take  1 tablet (2,000 mcg total) by mouth daily. 90 tablet 1   Desvenlafaxine Succinate (PRISTIQ PO) Take by mouth. Pt is not sure of the dose     omega-3 acid ethyl esters (LOVAZA) 1 g capsule Take 2 capsules (2 g total) by mouth 2 (two) times daily. 360 capsule 1   rosuvastatin (CRESTOR) 20 MG tablet TAKE 1 TABLET BY MOUTH DAILY 90 tablet 0   amphetamine-dextroamphetamine (ADDERALL) 10 MG tablet Take 1 tablet (10 mg total) by mouth daily with breakfast. 30 tablet 0   amphetamine-dextroamphetamine (ADDERALL) 20 MG tablet Take 1 tablet (20 mg total) by mouth daily. 30 tablet 0   folic acid (FOLVITE) 1 MG tablet TAKE ONE TABLET BY MOUTH DAILY 90 tablet 1   amitriptyline (ELAVIL) 25 MG tablet Take 25 mg by mouth at bedtime.     No facility-administered medications prior to visit.    ROS Review of Systems  Constitutional:  Negative for appetite change, diaphoresis, fatigue and unexpected weight change.  HENT: Negative.  Negative for trouble swallowing and voice change.   Eyes:  Negative for visual disturbance.  Respiratory:  Negative for cough, chest tightness, shortness of breath and wheezing.   Cardiovascular:  Positive for palpitations ("fluttering sensation"). Negative for chest pain and leg swelling.  Gastrointestinal:  Negative for abdominal pain, constipation, diarrhea, nausea and vomiting.  Genitourinary: Negative.  Negative for difficulty urinating, scrotal swelling and testicular pain.  Musculoskeletal: Negative.  Negative for arthralgias and back pain.  Skin: Negative.   Neurological:  Negative for dizziness, weakness and light-headedness.  Hematological:  Negative  for adenopathy. Does not bruise/bleed easily.  Psychiatric/Behavioral:  Positive for decreased concentration. Negative for dysphoric mood, sleep disturbance and suicidal ideas. The patient is nervous/anxious.     Objective:  BP (!) 142/94 (BP Location: Right Arm, Patient Position: Sitting, Cuff Size: Large)   Pulse 84    Temp 98.3 F (36.8 C) (Oral)   Resp 16   Ht 5\' 9"  (1.753 m)   Wt 214 lb 9.6 oz (97.3 kg)   SpO2 98%   BMI 31.69 kg/m   BP Readings from Last 3 Encounters:  04/21/23 (!) 142/94  10/13/22 124/72  05/28/22 118/84    Wt Readings from Last 3 Encounters:  04/21/23 214 lb 9.6 oz (97.3 kg)  10/13/22 211 lb (95.7 kg)  05/28/22 213 lb (96.6 kg)    Physical Exam Vitals reviewed.  Constitutional:      Appearance: Normal appearance.  HENT:     Mouth/Throat:     Mouth: Mucous membranes are moist.  Eyes:     General: No scleral icterus.    Conjunctiva/sclera: Conjunctivae normal.  Cardiovascular:     Rate and Rhythm: Normal rate and regular rhythm.     Pulses: Normal pulses.     Heart sounds: No murmur heard.    No friction rub. No gallop.     Comments: EKG- NSR, 83 bpm No LVH, Q waves, or ST/T wave changes  Pulmonary:     Effort: Pulmonary effort is normal.     Breath sounds: No stridor. No wheezing, rhonchi or rales.  Abdominal:     General: Abdomen is flat.     Palpations: There is no mass.     Tenderness: There is no abdominal tenderness. There is no guarding.     Hernia: No hernia is present.  Musculoskeletal:        General: Normal range of motion.     Cervical back: Neck supple.     Right lower leg: No edema.     Left lower leg: No edema.  Lymphadenopathy:     Cervical: No cervical adenopathy.  Skin:    General: Skin is warm and dry.     Findings: No rash.  Neurological:     General: No focal deficit present.     Mental Status: He is alert. Mental status is at baseline.  Psychiatric:        Attention and Perception: Perception normal. He is inattentive.        Mood and Affect: Mood is anxious. Mood is not depressed. Affect is not labile, blunt, flat or angry.        Speech: Speech normal.        Behavior: Behavior normal.        Thought Content: Thought content normal.        Cognition and Memory: Cognition normal.        Judgment: Judgment normal.      Lab Results  Component Value Date   WBC 5.7 04/21/2023   HGB 14.8 04/21/2023   HCT 43.4 04/21/2023   PLT 282.0 04/21/2023   GLUCOSE 100 (H) 04/21/2023   CHOL 172 04/21/2023   TRIG 136.0 04/21/2023   HDL 49.50 04/21/2023   LDLDIRECT 81.0 10/28/2021   LDLCALC 95 04/21/2023   ALT 46 04/21/2023   AST 27 04/21/2023   NA 138 04/21/2023   K 4.1 04/21/2023   CL 100 04/21/2023   CREATININE 1.10 04/21/2023   BUN 15 04/21/2023   CO2 27 04/21/2023   TSH 1.60 04/21/2023  DG Abd Acute W/Chest Result Date: 06/26/2016 CLINICAL DATA:  Left lower quadrant pain for 2 days.  Constipation. EXAM: DG ABDOMEN ACUTE W/ 1V CHEST COMPARISON:  None. FINDINGS: Single-view of the chest demonstrates clear lungs and normal heart size. No pneumothorax or pleural effusion. No bony abnormality. Two views of the abdomen show no free intraperitoneal air. The bowel gas pattern is nonobstructive. Moderately large stool burden noted. IMPRESSION: No acute abnormality. Moderately large stool burden. Electronically Signed   By: Drusilla Kanner M.D.   On: 06/26/2016 10:31    Assessment & Plan:  Palpitations- Will evaluate for a dysrhythmia. -     EKG 12-Lead -     TSH; Future -     LONG TERM MONITOR (3-14 DAYS); Future  Primary hypertension- EKG is negative for LVH. Will start indapamide. -     Basic metabolic panel; Future -     Urinalysis, Routine w reflex microscopic; Future -     Indapamide; Take 1 tablet (1.25 mg total) by mouth daily.  Dispense: 90 tablet; Refill: 0  Gastroesophageal reflux disease with esophagitis without hemorrhage -     CBC with Differential/Platelet; Future  Encounter for general adult medical examination with abnormal findings- Exam completed, labs reviewed, vaccines reviewed, no cancer screenings indicated, pt ed material was given.   Pure hyperglyceridemia -     Lipid panel; Future  Vitamin B12 deficiency (dietary) anemia -     CBC with Differential/Platelet; Future -      Vitamin B12; Future  Other folate deficiency anemias -     Folic Acid; Take 1 tablet (1 mg total) by mouth daily.  Dispense: 90 tablet; Refill: 1 -     Vitamin B12; Future  Essential hypertension, benign -     Hepatic function panel; Future -     TSH; Future -     Basic metabolic panel; Future -     CBC with Differential/Platelet; Future  Attention deficit hyperactivity disorder (ADHD), predominantly inattentive type -     Amphetamine-Dextroamphetamine; Take 1.5 tablets (30 mg total) by mouth daily.  Dispense: 45 tablet; Refill: 0  Class 1 obesity due to excess calories with serious comorbidity and body mass index (BMI) of 32.0 to 32.9 in adult -     Zepbound; Inject 2.5 mg into the skin once a week.  Dispense: 4 mL; Refill: 0     Follow-up: Return in about 3 months (around 07/22/2023).  Sanda Linger, MD

## 2023-04-21 NOTE — Patient Instructions (Signed)
 Health Maintenance, Male  Adopting a healthy lifestyle and getting preventive care are important in promoting health and wellness. Ask your health care provider about:  The right schedule for you to have regular tests and exams.  Things you can do on your own to prevent diseases and keep yourself healthy.  What should I know about diet, weight, and exercise?  Eat a healthy diet    Eat a diet that includes plenty of vegetables, fruits, low-fat dairy products, and lean protein.  Do not eat a lot of foods that are high in solid fats, added sugars, or sodium.  Maintain a healthy weight  Body mass index (BMI) is a measurement that can be used to identify possible weight problems. It estimates body fat based on height and weight. Your health care provider can help determine your BMI and help you achieve or maintain a healthy weight.  Get regular exercise  Get regular exercise. This is one of the most important things you can do for your health. Most adults should:  Exercise for at least 150 minutes each week. The exercise should increase your heart rate and make you sweat (moderate-intensity exercise).  Do strengthening exercises at least twice a week. This is in addition to the moderate-intensity exercise.  Spend less time sitting. Even light physical activity can be beneficial.  Watch cholesterol and blood lipids  Have your blood tested for lipids and cholesterol at 41 years of age, then have this test every 5 years.  You may need to have your cholesterol levels checked more often if:  Your lipid or cholesterol levels are high.  You are older than 41 years of age.  You are at high risk for heart disease.  What should I know about cancer screening?  Many types of cancers can be detected early and may often be prevented. Depending on your health history and family history, you may need to have cancer screening at various ages. This may include screening for:  Colorectal cancer.  Prostate cancer.  Skin cancer.  Lung  cancer.  What should I know about heart disease, diabetes, and high blood pressure?  Blood pressure and heart disease  High blood pressure causes heart disease and increases the risk of stroke. This is more likely to develop in people who have high blood pressure readings or are overweight.  Talk with your health care provider about your target blood pressure readings.  Have your blood pressure checked:  Every 3-5 years if you are 9-95 years of age.  Every year if you are 85 years old or older.  If you are between the ages of 29 and 29 and are a current or former smoker, ask your health care provider if you should have a one-time screening for abdominal aortic aneurysm (AAA).  Diabetes  Have regular diabetes screenings. This checks your fasting blood sugar level. Have the screening done:  Once every three years after age 23 if you are at a normal weight and have a low risk for diabetes.  More often and at a younger age if you are overweight or have a high risk for diabetes.  What should I know about preventing infection?  Hepatitis B  If you have a higher risk for hepatitis B, you should be screened for this virus. Talk with your health care provider to find out if you are at risk for hepatitis B infection.  Hepatitis C  Blood testing is recommended for:  Everyone born from [birth year] through 1963.  Anyone  with known risk factors for hepatitis C.  Sexually transmitted infections (STIs)  You should be screened each year for STIs, including gonorrhea and chlamydia, if:  You are sexually active and are younger than 41 years of age.  You are older than 41 years of age and your health care provider tells you that you are at risk for this type of infection.  Your sexual activity has changed since you were last screened, and you are at increased risk for chlamydia or gonorrhea. Ask your health care provider if you are at risk.  Ask your health care provider about whether you are at high risk for HIV. Your health care provider  may recommend a prescription medicine to help prevent HIV infection. If you choose to take medicine to prevent HIV, you should first get tested for HIV. You should then be tested every 3 months for as long as you are taking the medicine.  Follow these instructions at home:  Alcohol use  Do not drink alcohol if your health care provider tells you not to drink.  If you drink alcohol:  Limit how much you have to 0-2 drinks a day.  Know how much alcohol is in your drink. In the [place] one drink equals one 12 oz bottle of beer (355 mL), one 5 oz glass of wine (148 mL), or one 1 oz glass of hard liquor (44 mL).  Lifestyle  Do not use any products that contain nicotine or tobacco. These products include cigarettes, chewing tobacco, and vaping devices, such as e-cigarettes. If you need help quitting, ask your health care provider.  Do not use street drugs.  Do not share needles.  Ask your health care provider for help if you need support or information about quitting drugs.  General instructions  Schedule regular health, dental, and eye exams.  Stay current with your vaccines.  Tell your health care provider if:  You often feel depressed.  You have ever been abused or do not feel safe at home.  Summary  Adopting a healthy lifestyle and getting preventive care are important in promoting health and wellness.  Follow your health care provider's instructions about healthy diet, exercising, and getting tested or screened for diseases.  Follow your health care provider's instructions on monitoring your cholesterol and blood pressure.  This information is not intended to replace advice given to you by your health care provider. Make sure you discuss any questions you have with your health care provider.  Document Revised: 06/11/2020 Document Reviewed: 06/11/2020  Elsevier Patient Education  2024 [employer].

## 2023-04-23 ENCOUNTER — Encounter: Payer: Self-pay | Admitting: Internal Medicine

## 2023-04-24 ENCOUNTER — Other Ambulatory Visit (HOSPITAL_COMMUNITY): Payer: Self-pay

## 2023-04-24 ENCOUNTER — Telehealth: Payer: Self-pay | Admitting: Pharmacy Technician

## 2023-04-24 NOTE — Telephone Encounter (Signed)
 Pharmacy Patient Advocate Encounter   Received notification from [facility] Portal that prior authorization for ZEPBOUND 2.5MG  is required/requested.   Insurance verification completed.   The patient is insured through [employer] .   Per test claim: [id number]  SUBMITTED AND PENDING

## 2023-04-27 ENCOUNTER — Other Ambulatory Visit (HOSPITAL_COMMUNITY): Payer: Self-pay

## 2023-04-27 NOTE — Telephone Encounter (Signed)
PA NEEDED

## 2023-04-28 ENCOUNTER — Other Ambulatory Visit (HOSPITAL_COMMUNITY): Payer: Self-pay

## 2023-04-28 NOTE — Telephone Encounter (Signed)
 According to his insurance this is still pending with expiration date of 04/29/23, I will check in the morning

## 2023-04-28 NOTE — Telephone Encounter (Signed)
 Calling his insurance today

## 2023-04-29 ENCOUNTER — Other Ambulatory Visit (HOSPITAL_COMMUNITY): Payer: Self-pay

## 2023-04-29 NOTE — Telephone Encounter (Signed)
 Pharmacy Patient Advocate Encounter  Received notification from [employer] that Prior Authorization for ZEPBOUND 2.5MG  has been APPROVED and pt's copay is $24.99  Approval letter will be uploaded into [employer]

## 2023-04-29 NOTE — Telephone Encounter (Signed)
 Patient has been made aware.

## 2023-04-30 ENCOUNTER — Ambulatory Visit: Payer: 59 | Admitting: Internal Medicine

## 2023-05-23 DIAGNOSIS — R002 Palpitations: Secondary | ICD-10-CM

## 2023-05-24 ENCOUNTER — Encounter: Payer: Self-pay | Admitting: Internal Medicine

## 2023-05-25 ENCOUNTER — Other Ambulatory Visit: Payer: Self-pay | Admitting: Internal Medicine

## 2023-05-25 DIAGNOSIS — F900 Attention-deficit hyperactivity disorder, predominantly inattentive type: Secondary | ICD-10-CM

## 2023-05-27 MED ORDER — AMPHETAMINE-DEXTROAMPHETAMINE 20 MG PO TABS: 30.0000 mg | ORAL_TABLET | Freq: Every day | ORAL | 0 refills | Status: DC

## 2023-06-18 ENCOUNTER — Encounter: Payer: Self-pay | Admitting: Internal Medicine

## 2023-06-19 ENCOUNTER — Other Ambulatory Visit: Payer: Self-pay | Admitting: Internal Medicine

## 2023-06-19 DIAGNOSIS — E6609 Other obesity due to excess calories: Secondary | ICD-10-CM

## 2023-06-19 MED ORDER — ZEPBOUND 5 MG/0.5ML ~~LOC~~ SOLN: 5.0000 mg | SUBCUTANEOUS | 1 refills | Status: DC

## 2023-06-22 ENCOUNTER — Other Ambulatory Visit: Payer: Self-pay | Admitting: Internal Medicine

## 2023-06-22 DIAGNOSIS — E785 Hyperlipidemia, unspecified: Secondary | ICD-10-CM

## 2023-06-26 ENCOUNTER — Other Ambulatory Visit: Payer: Self-pay

## 2023-06-26 ENCOUNTER — Encounter: Payer: Self-pay | Admitting: Internal Medicine

## 2023-06-26 DIAGNOSIS — F900 Attention-deficit hyperactivity disorder, predominantly inattentive type: Secondary | ICD-10-CM

## 2023-06-26 MED ORDER — AMPHETAMINE-DEXTROAMPHETAMINE 20 MG PO TABS: 30.0000 mg | ORAL_TABLET | Freq: Every day | ORAL | 0 refills | Status: DC

## 2023-07-19 ENCOUNTER — Other Ambulatory Visit: Payer: Self-pay | Admitting: Internal Medicine

## 2023-07-19 DIAGNOSIS — I10 Essential (primary) hypertension: Secondary | ICD-10-CM

## 2023-07-27 ENCOUNTER — Encounter: Payer: Self-pay | Admitting: Internal Medicine

## 2023-07-28 ENCOUNTER — Other Ambulatory Visit: Payer: Self-pay

## 2023-07-28 DIAGNOSIS — E781 Pure hyperglyceridemia: Secondary | ICD-10-CM

## 2023-07-28 DIAGNOSIS — F900 Attention-deficit hyperactivity disorder, predominantly inattentive type: Secondary | ICD-10-CM

## 2023-07-28 MED ORDER — AMPHETAMINE-DEXTROAMPHETAMINE 20 MG PO TABS: 30.0000 mg | ORAL_TABLET | Freq: Every day | ORAL | 0 refills | Status: DC

## 2023-07-28 MED ORDER — OMEGA-3-ACID ETHYL ESTERS 1 G PO CAPS: 2.0000 | ORAL_CAPSULE | Freq: Two times a day (BID) | ORAL | 1 refills | Status: AC

## 2023-08-25 ENCOUNTER — Other Ambulatory Visit: Payer: Self-pay | Admitting: Internal Medicine

## 2023-08-25 ENCOUNTER — Encounter: Payer: Self-pay | Admitting: Internal Medicine

## 2023-08-25 DIAGNOSIS — F900 Attention-deficit hyperactivity disorder, predominantly inattentive type: Secondary | ICD-10-CM

## 2023-08-25 MED ORDER — AMPHETAMINE-DEXTROAMPHETAMINE 20 MG PO TABS: 30.0000 mg | ORAL_TABLET | Freq: Every day | ORAL | 0 refills | Status: DC

## 2023-09-18 ENCOUNTER — Other Ambulatory Visit: Payer: Self-pay | Admitting: Internal Medicine

## 2023-09-18 DIAGNOSIS — E785 Hyperlipidemia, unspecified: Secondary | ICD-10-CM

## 2023-09-23 ENCOUNTER — Encounter: Payer: Self-pay | Admitting: Internal Medicine

## 2023-09-24 ENCOUNTER — Other Ambulatory Visit: Payer: Self-pay

## 2023-09-24 DIAGNOSIS — F900 Attention-deficit hyperactivity disorder, predominantly inattentive type: Secondary | ICD-10-CM

## 2023-09-24 MED ORDER — AMPHETAMINE-DEXTROAMPHETAMINE 20 MG PO TABS: 30.0000 mg | ORAL_TABLET | Freq: Every day | ORAL | 0 refills | Status: DC

## 2023-10-22 ENCOUNTER — Encounter: Payer: Self-pay | Admitting: Internal Medicine

## 2023-10-23 ENCOUNTER — Other Ambulatory Visit: Payer: Self-pay | Admitting: Internal Medicine

## 2023-10-23 DIAGNOSIS — I10 Essential (primary) hypertension: Secondary | ICD-10-CM

## 2023-10-23 DIAGNOSIS — F900 Attention-deficit hyperactivity disorder, predominantly inattentive type: Secondary | ICD-10-CM

## 2023-10-23 MED ORDER — AMPHETAMINE-DEXTROAMPHETAMINE 20 MG PO TABS: 30.0000 mg | ORAL_TABLET | Freq: Every day | ORAL | 0 refills | Status: DC

## 2023-10-26 ENCOUNTER — Encounter: Payer: Self-pay | Admitting: Internal Medicine

## 2023-10-26 ENCOUNTER — Ambulatory Visit (INDEPENDENT_AMBULATORY_CARE_PROVIDER_SITE_OTHER): Admitting: Internal Medicine

## 2023-10-26 VITALS — BP 130/86 | HR 85 | Temp 97.6°F | Resp 16 | Ht 69.0 in | Wt 175.6 lb

## 2023-10-26 DIAGNOSIS — Z23 Encounter for immunization: Secondary | ICD-10-CM

## 2023-10-26 DIAGNOSIS — I10 Essential (primary) hypertension: Secondary | ICD-10-CM

## 2023-10-26 DIAGNOSIS — F900 Attention-deficit hyperactivity disorder, predominantly inattentive type: Secondary | ICD-10-CM | POA: Diagnosis not present

## 2023-10-26 LAB — CBC WITH DIFFERENTIAL/PLATELET
Basophils Absolute: 0.1 K/uL (ref 0.0–0.1)
Basophils Relative: 0.8 % (ref 0.0–3.0)
Eosinophils Absolute: 0 K/uL (ref 0.0–0.7)
Eosinophils Relative: 0.3 % (ref 0.0–5.0)
HCT: 42.3 % (ref 39.0–52.0)
Hemoglobin: 14.3 g/dL (ref 13.0–17.0)
Lymphocytes Relative: 28.6 % (ref 12.0–46.0)
Lymphs Abs: 2 K/uL (ref 0.7–4.0)
MCHC: 33.8 g/dL (ref 30.0–36.0)
MCV: 86.4 fl (ref 78.0–100.0)
Monocytes Absolute: 0.4 K/uL (ref 0.1–1.0)
Monocytes Relative: 5.4 % (ref 3.0–12.0)
Neutro Abs: 4.4 K/uL (ref 1.4–7.7)
Neutrophils Relative %: 64.9 % (ref 43.0–77.0)
Platelets: 287 K/uL (ref 150.0–400.0)
RBC: 4.9 Mil/uL (ref 4.22–5.81)
RDW: 13.7 % (ref 11.5–15.5)
WBC: 6.8 K/uL (ref 4.0–10.5)

## 2023-10-26 LAB — BASIC METABOLIC PANEL WITH GFR
BUN: 11 mg/dL (ref 6–23)
CO2: 30 meq/L (ref 19–32)
Calcium: 9.4 mg/dL (ref 8.4–10.5)
Chloride: 98 meq/L (ref 96–112)
Creatinine, Ser: 1.07 mg/dL (ref 0.40–1.50)
GFR: 86.21 mL/min (ref >60.00)
Glucose, Bld: 88 mg/dL (ref 70–99)
Potassium: 3.6 meq/L (ref 3.5–5.1)
Sodium: 137 meq/L (ref 135–145)

## 2023-10-26 NOTE — Patient Instructions (Signed)
 Hypertension, Adult High blood pressure (hypertension) is when the force of blood pumping through the arteries is too strong. The arteries are the blood vessels that carry blood from the heart throughout the body. Hypertension forces the heart to work harder to pump blood and may cause arteries to become narrow or stiff. Untreated or uncontrolled hypertension can lead to a heart attack, heart failure, a stroke, kidney disease, and other problems. A blood pressure reading consists of a higher number over a lower number. Ideally, your blood pressure should be below 120/80. The first ("top") number is called the systolic pressure. It is a measure of the pressure in your arteries as your heart beats. The second ("bottom") number is called the diastolic pressure. It is a measure of the pressure in your arteries as the heart relaxes. What are the causes? The exact cause of this condition is not known. There are some conditions that result in high blood pressure. What increases the risk? Certain factors may make you more likely to develop high blood pressure. Some of these risk factors are under your control, including: Smoking. Not getting enough exercise or physical activity. Being overweight. Having too much fat, sugar, calories, or salt (sodium) in your diet. Drinking too much alcohol. Other risk factors include: Having a personal history of heart disease, diabetes, high cholesterol, or kidney disease. Stress. Having a family history of high blood pressure and high cholesterol. Having obstructive sleep apnea. Age. The risk increases with age. What are the signs or symptoms? High blood pressure may not cause symptoms. Very high blood pressure (hypertensive crisis) may cause: Headache. Fast or irregular heartbeats (palpitations). Shortness of breath. Nosebleed. Nausea and vomiting. Vision changes. Severe chest pain, dizziness, and seizures. How is this diagnosed? This condition is diagnosed by  measuring your blood pressure while you are seated, with your arm resting on a flat surface, your legs uncrossed, and your feet flat on the floor. The cuff of the blood pressure monitor will be placed directly against the skin of your upper arm at the level of your heart. Blood pressure should be measured at least twice using the same arm. Certain conditions can cause a difference in blood pressure between your right and left arms. If you have a high blood pressure reading during one visit or you have normal blood pressure with other risk factors, you may be asked to: Return on a different day to have your blood pressure checked again. Monitor your blood pressure at home for 1 week or longer. If you are diagnosed with hypertension, you may have other blood or imaging tests to help your health care provider understand your overall risk for other conditions. How is this treated? This condition is treated by making healthy lifestyle changes, such as eating healthy foods, exercising more, and reducing your alcohol intake. You may be referred for counseling on a healthy diet and physical activity. Your health care provider may prescribe medicine if lifestyle changes are not enough to get your blood pressure under control and if: Your systolic blood pressure is above 130. Your diastolic blood pressure is above 80. Your personal target blood pressure may vary depending on your medical conditions, your age, and other factors. Follow these instructions at home: Eating and drinking  Eat a diet that is high in fiber and potassium, and low in sodium, added sugar, and fat. An example of this eating plan is called the DASH diet. DASH stands for Dietary Approaches to Stop Hypertension. To eat this way: Eat  plenty of fresh fruits and vegetables. Try to fill one half of your plate at each meal with fruits and vegetables. Eat whole grains, such as whole-wheat pasta, brown rice, or whole-grain bread. Fill about one  fourth of your plate with whole grains. Eat or drink low-fat dairy products, such as skim milk or low-fat yogurt. Avoid fatty cuts of meat, processed or cured meats, and poultry with skin. Fill about one fourth of your plate with lean proteins, such as fish, chicken without skin, beans, eggs, or tofu. Avoid pre-made and processed foods. These tend to be higher in sodium, added sugar, and fat. Reduce your daily sodium intake. Many people with hypertension should eat less than 1,500 mg of sodium a day. Do not drink alcohol if: Your health care provider tells you not to drink. You are pregnant, may be pregnant, or are planning to become pregnant. If you drink alcohol: Limit how much you have to: 0-1 drink a day for women. 0-2 drinks a day for men. Know how much alcohol is in your drink. In the [place] one drink equals one 12 oz bottle of beer (355 mL), one 5 oz glass of wine (148 mL), or one 1 oz glass of hard liquor (44 mL). Lifestyle  Work with your health care provider to maintain a healthy body weight or to lose weight. Ask what an ideal weight is for you. Get at least 30 minutes of exercise that causes your heart to beat faster (aerobic exercise) most days of the week. Activities may include walking, swimming, or biking. Include exercise to strengthen your muscles (resistance exercise), such as Pilates or lifting weights, as part of your weekly exercise routine. Try to do these types of exercises for 30 minutes at least 3 days a week. Do not use any products that contain nicotine or tobacco. These products include cigarettes, chewing tobacco, and vaping devices, such as e-cigarettes. If you need help quitting, ask your health care provider. Monitor your blood pressure at home as told by your health care provider. Keep all follow-up visits. This is important. Medicines Take over-the-counter and prescription medicines only as told by your health care provider. Follow directions carefully. Blood  pressure medicines must be taken as prescribed. Do not skip doses of blood pressure medicine. Doing this puts you at risk for problems and can make the medicine less effective. Ask your health care provider about side effects or reactions to medicines that you should watch for. Contact a health care provider if you: Think you are having a reaction to a medicine you are taking. Have headaches that keep coming back (recurring). Feel dizzy. Have swelling in your ankles. Have trouble with your vision. Get help right away if you: Develop a severe headache or confusion. Have unusual weakness or numbness. Feel faint. Have severe pain in your chest or abdomen. Vomit repeatedly. Have trouble breathing. These symptoms may be an emergency. Get help right away. Call 911. Do not wait to see if the symptoms will go away. Do not drive yourself to the hospital. Summary Hypertension is when the force of blood pumping through your arteries is too strong. If this condition is not controlled, it may put you at risk for serious complications. Your personal target blood pressure may vary depending on your medical conditions, your age, and other factors. For most people, a normal blood pressure is less than 120/80. Hypertension is treated with lifestyle changes, medicines, or a combination of both. Lifestyle changes include losing weight, eating a healthy,  low-sodium diet, exercising more, and limiting alcohol. This information is not intended to replace advice given to you by your health care provider. Make sure you discuss any questions you have with your health care provider. Document Revised: 11/27/2020 Document Reviewed: 11/27/2020 Elsevier Patient Education  2024 [employer].

## 2023-10-26 NOTE — Progress Notes (Signed)
 Subjective:  Patient ID: Kenneth Haney, male    DOB: [birth year]  Age: 43 y.o. MRN: [id number]  CC: Hypertension   HPI Kenneth Haney presents for f/up ---  Discussed the use of AI scribe software for clinical note transcription with the patient, who gave verbal consent to proceed.  History of Present Illness Kenneth Haney is a 43 year old male with hypertension who presents with side effects related to Zepbound  and blood pressure management.  He experiences occasional blurry vision, particularly around 6 PM, which he associates with Zepbound . His vision is generally good, but this blurriness is a new occurrence.  He also experiences dizziness and lightheadedness, which he believes may be related to his blood pressure being too low due to significant weight loss of approximately 33.5 pounds. He monitors his blood pressure at home and notes that the diastolic number was sometimes high, but he has not checked it in about a month. His blood pressure readings at home were around 132/86, and he questioned if the diastolic value was a little high.  He has a history of anxiety and mentions that losing weight has positively impacted his anxiety levels. He experiences heartburn, which is improving, and constipation, which has also gotten better.  He reports difficulty sleeping on the day of his Zepbound  injection, a side effect he has read about online. For sleep, he takes melatonin and amitriptyline. No trouble swallowing or breathing, and his wife is pleased he no longer snores.     Outpatient Medications Prior to Visit  Medication Sig Dispense Refill   amitriptyline (ELAVIL) 25 MG tablet Take 25 mg by mouth at bedtime.     cyanocobalamin  2000 MCG tablet Take 1 tablet (2,000 mcg total) by mouth daily. 90 tablet 1   Desvenlafaxine Succinate (PRISTIQ PO) Take by mouth. Pt is not sure of the dose     folic acid  (FOLVITE ) 1 MG tablet Take 1 tablet (1 mg total) by mouth daily. 90 tablet  1   omega-3 acid ethyl esters (LOVAZA ) 1 g capsule Take 2 capsules (2 g total) by mouth 2 (two) times daily. 360 capsule 1   rosuvastatin  (CRESTOR ) 20 MG tablet TAKE 1 TABLET BY MOUTH DAILY 90 tablet 0   tirzepatide  (ZEPBOUND ) 5 MG/0.5ML injection vial Inject 5 mg into the skin once a week. 6 mL 1   amphetamine -dextroamphetamine  (ADDERALL) 20 MG tablet Take 1.5 tablets (30 mg total) by mouth daily. 45 tablet 0   indapamide  (LOZOL ) 1.25 MG tablet TAKE 1 TABLET BY MOUTH DAILY 90 tablet 0   No facility-administered medications prior to visit.    ROS Review of Systems  Constitutional:  Negative for appetite change, chills, diaphoresis, fatigue and fever.  HENT: Negative.    Eyes: Negative.   Respiratory: Negative.  Negative for cough, chest tightness, shortness of breath and wheezing.   Cardiovascular:  Negative for chest pain, palpitations and leg swelling.  Gastrointestinal: Negative.  Negative for abdominal pain, constipation, diarrhea, nausea and vomiting.  Endocrine: Negative.   Genitourinary: Negative.   Musculoskeletal: Negative.  Negative for arthralgias.  Skin: Negative.   Neurological:  Negative for dizziness, weakness and light-headedness.  Hematological:  Negative for adenopathy. Does not bruise/bleed easily.  Psychiatric/Behavioral:  The patient is nervous/anxious.     Objective:  BP 130/86 (BP Location: Right Arm, Patient Position: Sitting, Cuff Size: Normal)   Pulse 85   Temp 97.6 F (36.4 C) (Oral)   Resp 16   Ht 5' 9 (1.753 m)  Wt 175 lb 9.6 oz (79.7 kg)   SpO2 97%   BMI 25.93 kg/m   BP Readings from Last 3 Encounters:  10/26/23 130/86  04/21/23 (!) 142/94  10/13/22 124/72    Wt Readings from Last 3 Encounters:  10/26/23 175 lb 9.6 oz (79.7 kg)  04/21/23 214 lb 9.6 oz (97.3 kg)  10/13/22 211 lb (95.7 kg)    Physical Exam Vitals reviewed.  Constitutional:      Appearance: Normal appearance.  HENT:     Mouth/Throat:     Mouth: Mucous membranes are  moist.  Eyes:     General: No scleral icterus.    Conjunctiva/sclera: Conjunctivae normal.  Cardiovascular:     Rate and Rhythm: Normal rate and regular rhythm.     Heart sounds: No murmur heard.    No friction rub. No gallop.  Pulmonary:     Effort: Pulmonary effort is normal.     Breath sounds: No stridor. No wheezing, rhonchi or rales.  Abdominal:     General: Abdomen is flat.     Palpations: There is no mass.     Tenderness: There is no abdominal tenderness. There is no guarding.     Hernia: No hernia is present.  Musculoskeletal:     Cervical back: Neck supple.     Right lower leg: No edema.     Left lower leg: No edema.  Lymphadenopathy:     Cervical: No cervical adenopathy.  Skin:    General: Skin is warm and dry.  Neurological:     General: No focal deficit present.     Mental Status: He is alert. Mental status is at baseline.  Psychiatric:        Attention and Perception: He is inattentive.        Mood and Affect: Mood is anxious. Mood is not depressed. Affect is not tearful.        Speech: Speech is tangential.        Behavior: Behavior normal. Behavior is cooperative.        Thought Content: Thought content normal. Thought content is not paranoid or delusional. Thought content does not include homicidal or suicidal ideation.        Cognition and Memory: Cognition normal.     Lab Results  Component Value Date   WBC 6.8 10/26/2023   HGB 14.3 10/26/2023   HCT 42.3 10/26/2023   PLT 287.0 10/26/2023   GLUCOSE 88 10/26/2023   CHOL 172 04/21/2023   TRIG 136.0 04/21/2023   HDL 49.50 04/21/2023   LDLDIRECT 81.0 10/28/2021   LDLCALC 95 04/21/2023   ALT 46 04/21/2023   AST 27 04/21/2023   NA 137 10/26/2023   K 3.6 10/26/2023   CL 98 10/26/2023   CREATININE 1.07 10/26/2023   BUN 11 10/26/2023   CO2 30 10/26/2023   TSH 1.60 04/21/2023    DG Abd Acute W/Chest Result Date: 06/26/2016 CLINICAL DATA:  Left lower quadrant pain for 2 days.  Constipation. EXAM: DG  ABDOMEN ACUTE W/ 1V CHEST COMPARISON:  None. FINDINGS: Single-view of the chest demonstrates clear lungs and normal heart size. No pneumothorax or pleural effusion. No bony abnormality. Two views of the abdomen show no free intraperitoneal air. The bowel gas pattern is nonobstructive. Moderately large stool burden noted. IMPRESSION: No acute abnormality. Moderately large stool burden. Electronically Signed   By: Debby Prader M.D.   On: 06/26/2016 10:31    Assessment & Plan:  Need for influenza vaccination -  Flu vaccine trivalent PF, 6mos and older(Flulaval,Afluria,Fluarix,Fluzone)  Essential hypertension, benign- His BP is well controlled. -     Basic metabolic panel with GFR; Future -     CBC with Differential/Platelet; Future -     Indapamide ; Take 1 tablet (1.25 mg total) by mouth daily.  Dispense: 90 tablet; Refill: 1  Attention deficit hyperactivity disorder (ADHD), predominantly inattentive type -     Amphetamine -Dextroamphetamine ; Take 1.5 tablets (30 mg total) by mouth daily.  Dispense: 45 tablet; Refill: 0     Follow-up: Return in about 6 months (around 04/24/2024).  Debby Molt, MD

## 2023-10-27 ENCOUNTER — Other Ambulatory Visit: Payer: Self-pay | Admitting: Internal Medicine

## 2023-10-27 ENCOUNTER — Ambulatory Visit: Payer: Self-pay | Admitting: Internal Medicine

## 2023-10-27 DIAGNOSIS — E785 Hyperlipidemia, unspecified: Secondary | ICD-10-CM

## 2023-10-27 MED ORDER — INDAPAMIDE 1.25 MG PO TABS: 1.2500 mg | ORAL_TABLET | Freq: Every day | ORAL | 1 refills | Status: DC

## 2023-10-27 MED ORDER — AMPHETAMINE-DEXTROAMPHETAMINE 20 MG PO TABS: 30.0000 mg | ORAL_TABLET | Freq: Every day | ORAL | 0 refills | Status: DC

## 2023-10-27 MED ORDER — ROSUVASTATIN CALCIUM 20 MG PO TABS: 20.0000 mg | ORAL_TABLET | Freq: Every day | ORAL | 0 refills | Status: DC

## 2023-10-29 NOTE — Telephone Encounter (Signed)
 Refill was sent 10/27/2023.

## 2023-11-19 ENCOUNTER — Other Ambulatory Visit: Payer: Self-pay | Admitting: Internal Medicine

## 2023-11-19 DIAGNOSIS — D528 Other folate deficiency anemias: Secondary | ICD-10-CM

## 2023-11-23 ENCOUNTER — Other Ambulatory Visit: Payer: Self-pay

## 2023-11-23 ENCOUNTER — Encounter: Payer: Self-pay | Admitting: Internal Medicine

## 2023-11-23 DIAGNOSIS — F900 Attention-deficit hyperactivity disorder, predominantly inattentive type: Secondary | ICD-10-CM

## 2023-11-23 MED ORDER — AMPHETAMINE-DEXTROAMPHETAMINE 20 MG PO TABS: 30.0000 mg | ORAL_TABLET | Freq: Every day | ORAL | 0 refills | Status: DC

## 2023-12-13 ENCOUNTER — Other Ambulatory Visit: Payer: Self-pay | Admitting: Internal Medicine

## 2023-12-13 DIAGNOSIS — E66811 Obesity, class 1: Secondary | ICD-10-CM

## 2023-12-21 ENCOUNTER — Encounter: Payer: Self-pay | Admitting: Internal Medicine

## 2023-12-23 ENCOUNTER — Other Ambulatory Visit: Payer: Self-pay

## 2023-12-23 ENCOUNTER — Encounter: Payer: Self-pay | Admitting: Internal Medicine

## 2023-12-23 DIAGNOSIS — F900 Attention-deficit hyperactivity disorder, predominantly inattentive type: Secondary | ICD-10-CM

## 2023-12-23 MED ORDER — AMPHETAMINE-DEXTROAMPHETAMINE 20 MG PO TABS: 30.0000 mg | ORAL_TABLET | Freq: Every day | ORAL | 0 refills | Status: DC

## 2024-01-17 ENCOUNTER — Other Ambulatory Visit: Payer: Self-pay | Admitting: Internal Medicine

## 2024-01-17 DIAGNOSIS — I10 Essential (primary) hypertension: Secondary | ICD-10-CM

## 2024-01-19 ENCOUNTER — Encounter: Payer: Self-pay | Admitting: Internal Medicine

## 2024-01-22 ENCOUNTER — Encounter: Payer: Self-pay | Admitting: Internal Medicine

## 2024-01-22 ENCOUNTER — Other Ambulatory Visit: Payer: Self-pay

## 2024-01-22 DIAGNOSIS — F900 Attention-deficit hyperactivity disorder, predominantly inattentive type: Secondary | ICD-10-CM

## 2024-01-23 MED ORDER — AMPHETAMINE-DEXTROAMPHETAMINE 20 MG PO TABS: 30.0000 mg | ORAL_TABLET | Freq: Every day | ORAL | 0 refills | Status: DC

## 2024-01-28 ENCOUNTER — Other Ambulatory Visit: Payer: Self-pay | Admitting: Internal Medicine

## 2024-01-28 DIAGNOSIS — E785 Hyperlipidemia, unspecified: Secondary | ICD-10-CM

## 2024-02-19 ENCOUNTER — Encounter: Payer: Self-pay | Admitting: Internal Medicine

## 2024-02-23 ENCOUNTER — Other Ambulatory Visit: Payer: Self-pay

## 2024-02-23 DIAGNOSIS — F900 Attention-deficit hyperactivity disorder, predominantly inattentive type: Secondary | ICD-10-CM

## 2024-02-23 MED ORDER — AMPHETAMINE-DEXTROAMPHETAMINE 20 MG PO TABS: 30.0000 mg | ORAL_TABLET | Freq: Every day | ORAL | 0 refills | Status: AC

## 2024-03-04 ENCOUNTER — Other Ambulatory Visit: Payer: Self-pay | Admitting: Internal Medicine

## 2024-03-04 DIAGNOSIS — E6609 Other obesity due to excess calories: Secondary | ICD-10-CM
